# Patient Record
Sex: Female | Born: 1995 | Race: Black or African American | Hispanic: No | Marital: Single | Smoking: Never smoker
Health system: Southern US, Community
[De-identification: ages and names within clinical notes are randomized; demographics above are authoritative.]

## PROBLEM LIST (undated history)

## (undated) ENCOUNTER — Inpatient Hospital Stay (HOSPITAL_COMMUNITY): Payer: Self-pay

## (undated) DIAGNOSIS — M419 Scoliosis, unspecified: Secondary | ICD-10-CM

## (undated) DIAGNOSIS — N200 Calculus of kidney: Secondary | ICD-10-CM

## (undated) HISTORY — DX: Scoliosis, unspecified: M41.9

## (undated) HISTORY — DX: Calculus of kidney: N20.0

---
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
## 2015-12-04 ENCOUNTER — Encounter (HOSPITAL_COMMUNITY): Payer: Self-pay | Admitting: *Deleted

## 2015-12-04 ENCOUNTER — Inpatient Hospital Stay (HOSPITAL_COMMUNITY): Payer: Managed Care, Other (non HMO)

## 2015-12-04 ENCOUNTER — Inpatient Hospital Stay (HOSPITAL_COMMUNITY)
Admission: AD | Admit: 2015-12-04 | Discharge: 2015-12-04 | Disposition: A | Discharge status: Home / Self Care | Payer: Managed Care, Other (non HMO) | Source: Ambulatory Visit | Attending: Family Medicine | Admitting: Family Medicine

## 2015-12-04 DIAGNOSIS — R102 Pelvic and perineal pain: Secondary | ICD-10-CM | POA: Diagnosis present

## 2015-12-04 DIAGNOSIS — Z3A08 8 weeks gestation of pregnancy: Secondary | ICD-10-CM | POA: Insufficient documentation

## 2015-12-04 DIAGNOSIS — Z3490 Encounter for supervision of normal pregnancy, unspecified, unspecified trimester: Secondary | ICD-10-CM

## 2015-12-04 DIAGNOSIS — R109 Unspecified abdominal pain: Secondary | ICD-10-CM | POA: Insufficient documentation

## 2015-12-04 DIAGNOSIS — O26891 Other specified pregnancy related conditions, first trimester: Secondary | ICD-10-CM

## 2015-12-04 DIAGNOSIS — O26899 Other specified pregnancy related conditions, unspecified trimester: Secondary | ICD-10-CM

## 2015-12-04 DIAGNOSIS — M549 Dorsalgia, unspecified: Secondary | ICD-10-CM | POA: Insufficient documentation

## 2015-12-04 DIAGNOSIS — O9989 Other specified diseases and conditions complicating pregnancy, childbirth and the puerperium: Secondary | ICD-10-CM

## 2015-12-04 LAB — URINALYSIS, ROUTINE W REFLEX MICROSCOPIC
BILIRUBIN URINE: NEGATIVE
Glucose, UA: NEGATIVE mg/dL
Ketones, ur: 15 mg/dL
Leukocytes, UA: NEGATIVE
NITRITE: NEGATIVE
Protein, ur: NEGATIVE mg/dL
SPECIFIC GRAVITY, URINE: 1.015 (ref 1.005–1.030)
pH: 6 (ref 5.0–8.0)

## 2015-12-04 LAB — CBC
HEMATOCRIT: 33.7 % — AB (ref 36.0–46.0)
HEMOGLOBIN: 11.5 g/dL — AB (ref 12.0–15.0)
MCH: 29.8 pg (ref 26.0–34.0)
MCHC: 34.1 g/dL (ref 30.0–36.0)
MCV: 87.3 fL (ref 78.0–100.0)
Platelets: 253 10*3/uL (ref 150–400)
RBC: 3.86 MIL/uL — ABNORMAL LOW (ref 3.87–5.11)
RDW: 12.7 % (ref 11.5–15.5)
WBC: 7.4 10*3/uL (ref 4.0–10.5)

## 2015-12-04 LAB — HCG, QUANTITATIVE, PREGNANCY: hCG, Beta Chain, Quant, S: 80829 m[IU]/mL — ABNORMAL HIGH (ref <5)

## 2015-12-04 LAB — URINE MICROSCOPIC-ADD ON

## 2015-12-04 LAB — POCT PREGNANCY, URINE: PREG TEST UR: POSITIVE — AB

## 2015-12-04 NOTE — MAU Provider Note (Signed)
History     CSN: [id number]  Arrival date and time: 12/04/15 1439   First Provider Initiated Contact with Patient 12/04/15 2105      Chief Complaint  Patient presents with  . Pelvic Pain   Pelvic Pain  The patient's primary symptoms include pelvic pain. This is a new problem. The current episode started yesterday. The problem occurs constantly. The problem has been unchanged. Pain severity now: 7/10  The problem affects the left side. She is pregnant. Associated symptoms include abdominal pain. Pertinent negatives include no chills, constipation, diarrhea, dysuria, fever, frequency, nausea, urgency or vomiting. The vaginal discharge was normal. There has been no bleeding. Nothing aggravates the symptoms. She has tried nothing for the symptoms. She is sexually active. Menstrual history: LMP 09/30/15    No past medical history on file.  No past surgical history on file.  No family history on file.  Social History  Substance Use Topics  . Smoking status: Not on file  . Smokeless tobacco: Not on file  . Alcohol use Not on file    Allergies: Allergies not on file  No prescriptions prior to admission.    Review of Systems  Constitutional: Negative for chills and fever.  Gastrointestinal: Positive for abdominal pain. Negative for constipation, diarrhea, nausea and vomiting.  Genitourinary: Positive for pelvic pain. Negative for dysuria, frequency and urgency.   Physical Exam   Blood pressure 112/77, pulse 66, temperature 98.3 F (36.8 C), temperature source Oral, resp. rate 16, height 5\' 3"  (1.6 m), weight 127 lb (57.6 kg), last menstrual period 09/30/2015.  Physical Exam  Nursing note and vitals reviewed. Constitutional: She is oriented to person, place, and time. She appears well-developed and well-nourished. No distress.  HENT:  Head: Normocephalic.  Cardiovascular: Normal rate.   Respiratory: Effort normal.  GI: Soft.  Neurological: She is alert and oriented to person,  place, and time.  Skin: Skin is warm and dry.  Psychiatric: She has a normal mood and affect.   Results for orders placed or performed during the hospital encounter of 12/04/15 (from the past 24 hour(s))  Urinalysis, Routine w reflex microscopic (not at [facility])     Status: Abnormal   Collection Time: 12/04/15  4:20 PM  Result Value Ref Range   Color, Urine YELLOW YELLOW   APPearance CLEAR CLEAR   Specific Gravity, Urine 1.015 1.005 - 1.030   pH 6.0 5.0 - 8.0   Glucose, UA NEGATIVE NEGATIVE mg/dL   Hgb urine dipstick TRACE (A) NEGATIVE   Bilirubin Urine NEGATIVE NEGATIVE   Ketones, ur 15 NEGATIVE mg/dL   Protein, ur NEGATIVE NEGATIVE mg/dL   Nitrite NEGATIVE NEGATIVE   Leukocytes, UA NEGATIVE NEGATIVE  Urine microscopic-add on     Status: Abnormal   Collection Time: 12/04/15  4:20 PM  Result Value Ref Range   Squamous Epithelial / LPF 6-30 (A) NONE SEEN   WBC, UA 0-5 0 - 5 WBC/hpf   RBC / HPF 0-5 0 - 5 RBC/hpf   Bacteria, UA MANY (A) NONE SEEN   Urine-Other MUCOUS PRESENT   Pregnancy, urine POC     Status: Abnormal   Collection Time: 12/04/15  4:30 PM  Result Value Ref Range   Preg Test, Ur POSITIVE (A) NEGATIVE  CBC     Status: Abnormal   Collection Time: 12/04/15  6:55 PM  Result Value Ref Range   WBC 7.4 4.0 - 10.5 K/uL   RBC 3.86 (L) 3.87 - 5.11 MIL/uL   Hemoglobin 11.5 (  L) 12.0 - 15.0 g/dL   HCT 78.233.7 (L) 95.636.0 - 21.346.0 %   MCV 87.3 78.0 - 100.0 fL   MCH 29.8 26.0 - 34.0 pg   MCHC 34.1 30.0 - 36.0 g/dL   RDW 08.612.7 57.811.5 - 46.915.5 %   Platelets 253 150 - 400 K/uL  ABO/Rh     Status: None (Preliminary result)   Collection Time: 12/04/15  6:55 PM  Result Value Ref Range   ABO/RH(D) A POS   hCG, quantitative, pregnancy     Status: Abnormal   Collection Time: 12/04/15  6:55 PM  Result Value Ref Range   hCG, Beta Chain, Quant, S 80,829 (H) <5 mIU/mL   [place] Ob Comp Less 14 Wks  Result Date: 12/04/2015 CLINICAL DATA:  Abdominal pain for a couple of days. EXAM: OBSTETRIC <14 WK US  AND TRANSVAGINAL OB US TECHNIQUE: Both transabdominal and transvaginal ultrasound examinations were performed for complete evaluation of the gestation as well as the maternal uterus, adnexal regions, and pelvic cul-de-sac. Transvaginal technique was performed to assess early pregnancy. COMPARISON:  None. FINDINGS: Intrauterine gestational sac: Single Yolk sac:  Present Embryo:  Present Cardiac Activity: Present Heart Rate: 159  bpm CRL:  19.4  mm   8 w   3 d                  US EDC: 07/12/2016 Subchorionic hemorrhage:  Small Maternal uterus/adnexae: Unremarkable appearance of the ovaries. No free fluid. IMPRESSION: Single living intrauterine pregnancy as above. Electronically Signed   By: Sebastian AcheAllen  Grady M.D.   On: 12/04/2015 21:02   [place] Ob Transvaginal  Result Date: 12/04/2015 CLINICAL DATA:  Abdominal pain for a couple of days. EXAM: OBSTETRIC <14 WK US AND TRANSVAGINAL OB US TECHNIQUE: Both transabdominal and transvaginal ultrasound examinations were performed for complete evaluation of the gestation as well as the maternal uterus, adnexal regions, and pelvic cul-de-sac. Transvaginal technique was performed to assess early pregnancy. COMPARISON:  None. FINDINGS: Intrauterine gestational sac: Single Yolk sac:  Present Embryo:  Present Cardiac Activity: Present Heart Rate: 159  bpm CRL:  19.4  mm   8 w   3 d                  US EDC: 07/12/2016 Subchorionic hemorrhage:  Small Maternal uterus/adnexae: Unremarkable appearance of the ovaries. No free fluid. IMPRESSION: Single living intrauterine pregnancy as above. Electronically Signed   By: Sebastian AcheAllen  Grady M.D.   On: 12/04/2015 21:02    MAU Course  Procedures  MDM   Assessment and Plan   1. Back pain affecting pregnancy in first trimester   2. Abdominal pain affecting pregnancy   3. Intrauterine pregnancy    DC home Comfort measures reviewed  1st Trimester precautions  RX: none  Return to MAU as needed FU with OB as planned  Follow-up Information     [facility].   Contact information: [address] [place] [id number] [phone]            Tawnya CrookHogan, Avangeline Stockburger Donovan 12/04/2015, 9:05 PM

## 2015-12-04 NOTE — Discharge Instructions (Signed)
Prenatal Care Providers °[facility]    [facility] ° Phone- [phone]     Phone: [phone] °         °[facility]                      [facility] ° @[place]     Phone: [phone] ° Phone: [phone] °        [facility] °[facility]     Phone: [phone] ° Phone: [phone]   °        [facility] °[facility] [place]                hone: [phone] ° Phone: [phone] °        [facility] °Dr. Bernard Marshall      Phone: [phone] ° Phone: [phone] °        [facility] °[facility]                Phone: [phone] ° Women’s [facility]  ° Phone:[phone]    [facility]) °         Phone: [phone] °[facility] °  Phone: [phone] °Safe Medications in Pregnancy  ° °Acne: °Benzoyl Peroxide °Salicylic Acid ° °Backache/Headache: °Tylenol: 2 regular strength every 4 hours OR °             2 Extra strength every 6 hours ° °Colds/Coughs/Allergies: °Benadryl (alcohol free) 25 mg every 6 hours as needed °Breath right strips °Claritin °Cepacol throat lozenges °Chloraseptic throat spray °Cold-Eeze- up to three times per day °Cough drops, alcohol free °Flonase (by prescription only) °Guaifenesin °Mucinex °Robitussin DM (plain only, alcohol free) °Saline nasal spray/drops °Sudafed (pseudoephedrine) & Actifed ** use only after [redacted] weeks gestation and if you do not have high blood pressure °Tylenol °Vicks Vaporub °Zinc lozenges °Zyrtec  ° °Constipation: °Colace °Ducolax suppositories °Fleet enema °Glycerin suppositories °Metamucil °Milk of magnesia °Miralax °Senokot °Smooth move tea ° °Diarrhea: °Kaopectate °Imodium A-D ° °*NO pepto Bismol ° °Hemorrhoids: °Anusol °Anusol HC °Preparation H °Tucks ° °Indigestion: °Tums °Maalox °Mylanta °Zantac  °Pepcid ° °Insomnia: °Benadryl (alcohol free) 25mg every 6 hours as needed °Tylenol  PM °Unisom, no Gelcaps ° °Leg Cramps: °Tums °MagGel ° °Nausea/Vomiting:  °Bonine °Dramamine °Emetrol °Ginger extract °Sea bands °Meclizine  °Nausea medication to take during pregnancy:  °Unisom (doxylamine succinate 25 mg tablets) Take one tablet daily at bedtime. If symptoms are not adequately controlled, the dose can be increased to a maximum recommended dose of two tablets daily (1/2 tablet in the morning, 1/2 tablet mid-afternoon and one at bedtime). °Vitamin B6 100mg tablets. Take one tablet twice a day (up to 200 mg per day). ° °Skin Rashes: °Aveeno products °Benadryl cream or 25mg every 6 hours as needed °Calamine Lotion °1% cortisone cream ° °Yeast infection: °Gyne-lotrimin 7 °Monistat 7 ° ° °**If taking multiple medications, please check labels to avoid duplicating the same active ingredients °**take medication as directed on the label °** Do not exceed 4000 mg of tylenol in 24 hours °**Do not take medications that contain aspirin or ibuprofen ° ° ° ° °

## 2015-12-04 NOTE — MAU Note (Signed)
Stomach has been really bad for a couple days now.  Hasn't had period since Sept.  +HPT 3 days ago.

## 2015-12-05 LAB — HIV ANTIBODY (ROUTINE TESTING W REFLEX): HIV Screen 4th Generation wRfx: NONREACTIVE

## 2015-12-05 LAB — ABO/RH: ABO/RH(D): A POS

## 2015-12-19 ENCOUNTER — Encounter: Payer: Self-pay | Admitting: Family Medicine

## 2016-01-03 ENCOUNTER — Encounter: Payer: Self-pay | Admitting: Family

## 2016-01-07 ENCOUNTER — Encounter: Payer: Self-pay | Admitting: Student

## 2016-01-07 ENCOUNTER — Ambulatory Visit (INDEPENDENT_AMBULATORY_CARE_PROVIDER_SITE_OTHER): Payer: Managed Care, Other (non HMO) | Admitting: Student

## 2016-01-07 VITALS — BP 112/57 | HR 73 | Wt 124.3 lb

## 2016-01-07 DIAGNOSIS — Z113 Encounter for screening for infections with a predominantly sexual mode of transmission: Secondary | ICD-10-CM

## 2016-01-07 DIAGNOSIS — Z124 Encounter for screening for malignant neoplasm of cervix: Secondary | ICD-10-CM

## 2016-01-07 DIAGNOSIS — Z23 Encounter for immunization: Secondary | ICD-10-CM

## 2016-01-07 DIAGNOSIS — Z3492 Encounter for supervision of normal pregnancy, unspecified, second trimester: Secondary | ICD-10-CM

## 2016-01-07 DIAGNOSIS — M419 Scoliosis, unspecified: Secondary | ICD-10-CM | POA: Insufficient documentation

## 2016-01-07 DIAGNOSIS — O0993 Supervision of high risk pregnancy, unspecified, third trimester: Secondary | ICD-10-CM | POA: Insufficient documentation

## 2016-01-07 DIAGNOSIS — Z3490 Encounter for supervision of normal pregnancy, unspecified, unspecified trimester: Secondary | ICD-10-CM

## 2016-01-07 DIAGNOSIS — Z87442 Personal history of urinary calculi: Secondary | ICD-10-CM

## 2016-01-07 LAB — POCT URINALYSIS DIP (DEVICE)
Glucose, UA: NEGATIVE mg/dL
KETONES UR: 80 mg/dL — AB
LEUKOCYTES UA: NEGATIVE
NITRITE: NEGATIVE
Protein, ur: NEGATIVE mg/dL
Specific Gravity, Urine: >=1.03 (ref 1.005–1.030)
Urobilinogen, UA: 0.2 mg/dL (ref 0.0–1.0)
pH: 5.5 (ref 5.0–8.0)

## 2016-01-07 MED ORDER — PRENATAL VITAMINS 28-0.8 MG PO TABS: 1.0000 | ORAL_TABLET | Freq: Every day | ORAL | 9 refills | Status: AC

## 2016-01-07 NOTE — Progress Notes (Signed)
Addendum , called Jeanne Duran to discuss if she wants genetic testing which she does, will come in next week for quad screen.

## 2016-01-07 NOTE — Progress Notes (Addendum)
   PRENATAL VISIT NOTE  Subjective:  Jeanne Duran is a 20 y.o. G1P0 at 5459w1d by LMP being seen today for ongoing prenatal care.  She is currently monitored for the following issues for this low-risk pregnancy and has ADHD (attention deficit hyperactivity disorder); Supervision of normal pregnancy; and History of kidney stones on her problem list.  Patient reports no complaints.  Contractions: Not present. Vag. Bleeding: None.  Movement: Absent. Denies leaking of fluid.   The following portions of the patient's history were reviewed and updated as appropriate: allergies, current medications, past family history, past medical history, past social history, past surgical history and problem list. Problem list updated.  Objective:   Vitals:   01/07/16 1021  BP: (!) 112/57  Pulse: 73  Weight: 124 lb 4.8 oz (56.4 kg)    Fetal Status: Fetal Heart Rate (bpm): 156   Movement: Absent     General:  Alert, oriented and cooperative. Patient is in no acute distress.  Skin: Skin is warm and dry. No rash noted.   Cardiovascular: Normal heart rate noted  Respiratory: Normal respiratory effort, no problems with respiration noted  Abdomen: Soft, gravid, appropriate for gestational age. Pain/Pressure: Present     Pelvic:  Cervical exam deferred       Vaginal and labia are normal; no discharge present. Cervix is pink and non-erythematous; no CMT.   Extremities: Normal range of motion.  Edema: None  Mental Status: Normal mood and affect. Normal behavior. Normal judgment and thought content.   Assessment and Plan:  Pregnancy: G1P0 at 9059w1d  1. Encounter for supervision of normal pregnancy, antepartum, unspecified gravidity Patient feels good; she is excited to be pregnant.   - Pain Mgmt, Profile 6 Conf w/o mM, U - Prenatal Profile - Hemoglobinopathy Evaluation - Cytology - PAP - GC/Chlamydia probe amp ([place])not at [facility] - Culture, OB Urine - US MFM OB COMP + 14 WK; Future - Prenatal Vit-Fe  Fumarate-FA (PRENATAL VITAMINS) 28-0.8 MG TABS; Take 1 tablet by mouth daily.  Dispense: 30 tablet; Refill: 9 - Flu Vaccine QUAD 36+ mos IM  2. History of kidney stones No signs and symptoms Please refer to After Visit Summary for other counseling recommendations.  Return in about 10 weeks (around 03/17/2016).   Marylene LandKathryn Lorraine Yasser Hepp, CNM

## 2016-01-07 NOTE — Progress Notes (Signed)
Here for initial prenatal visit. Given new patient education information. Enrolled in Baby Scripts optimization.

## 2016-01-08 LAB — CYTOLOGY - PAP
ADEQUACY: ABSENT
Diagnosis: NEGATIVE

## 2016-01-08 LAB — GC/CHLAMYDIA PROBE AMP (~~LOC~~) NOT AT ARMC
CHLAMYDIA, DNA PROBE: NEGATIVE
NEISSERIA GONORRHEA: NEGATIVE

## 2016-01-08 LAB — CULTURE, OB URINE: Organism ID, Bacteria: NO GROWTH

## 2016-01-09 LAB — PRENATAL PROFILE (SOLSTAS)
Antibody Screen: NEGATIVE
BASOS ABS: 0 {cells}/uL (ref 0–200)
BASOS PCT: 0 %
Eosinophils Absolute: 60 cells/uL (ref 15–500)
Eosinophils Relative: 1 %
HCT: 34.8 % — ABNORMAL LOW (ref 35.0–45.0)
HEP B S AG: NEGATIVE
HIV 1&2 Ab, 4th Generation: NONREACTIVE
Hemoglobin: 11.2 g/dL — ABNORMAL LOW (ref 11.7–15.5)
LYMPHS ABS: 1380 {cells}/uL (ref 850–3900)
Lymphocytes Relative: 23 %
MCH: 29 pg (ref 27.0–33.0)
MCHC: 32.2 g/dL (ref 32.0–36.0)
MCV: 90.2 fL (ref 80.0–100.0)
MONO ABS: 360 {cells}/uL (ref 200–950)
MPV: 10 fL (ref 7.5–12.5)
Monocytes Relative: 6 %
NEUTROS ABS: 4200 {cells}/uL (ref 1500–7800)
Neutrophils Relative %: 70 %
PLATELETS: 261 10*3/uL (ref 140–400)
RBC: 3.86 MIL/uL (ref 3.80–5.10)
RDW: 13.6 % (ref 11.0–15.0)
RUBELLA: 8.9 {index} — AB (ref <0.90)
Rh Type: POSITIVE
WBC: 6 10*3/uL (ref 3.8–10.8)

## 2016-01-10 LAB — HEMOGLOBINOPATHY EVALUATION
HEMATOCRIT: 34.8 % — AB (ref 35.0–45.0)
HEMOGLOBIN: 11.2 g/dL — AB (ref 11.7–15.5)
HGB A2 QUANT: 2.8 % (ref 1.8–3.5)
Hgb A: 96.2 % (ref >96.0)
Hgb F Quant: <1 % (ref <2.0)
MCH: 29 pg (ref 27.0–33.0)
MCV: 90.2 fL (ref 80.0–100.0)
RBC: 3.86 MIL/uL (ref 3.80–5.10)
RDW: 13.6 % (ref 11.0–15.0)

## 2016-01-11 LAB — PAIN MGMT, PROFILE 6 CONF W/O MM, U
6 Acetylmorphine: NEGATIVE ng/mL (ref <10)
AMPHETAMINES: NEGATIVE ng/mL (ref <500)
Alcohol Metabolites: NEGATIVE ng/mL (ref <500)
BENZODIAZEPINES: NEGATIVE ng/mL (ref <100)
Barbiturates: NEGATIVE ng/mL (ref <300)
COCAINE METABOLITE: NEGATIVE ng/mL (ref <150)
CREATININE: 289.8 mg/dL (ref >=20.0)
Marijuana Metabolite: 320 ng/mL — ABNORMAL HIGH (ref <5)
Marijuana Metabolite: POSITIVE ng/mL — AB (ref <20)
Methadone Metabolite: NEGATIVE ng/mL (ref <100)
OPIATES: NEGATIVE ng/mL (ref <100)
OXYCODONE: NEGATIVE ng/mL (ref <100)
Oxidant: POSITIVE ug/mL — AB (ref <200)
Phencyclidine: NEGATIVE ng/mL (ref <25)
Please note:: 0
pH: 7.18 (ref 4.5–9.0)

## 2016-01-14 ENCOUNTER — Other Ambulatory Visit: Payer: Managed Care, Other (non HMO)

## 2016-01-14 DIAGNOSIS — Z3402 Encounter for supervision of normal first pregnancy, second trimester: Secondary | ICD-10-CM

## 2016-01-15 LAB — AFP, QUAD SCREEN
AFP: 30.7 ng/mL
Age Alone: 1:1140 {titer}
CURR GEST AGE: 15.1 wk
HCG, Total: 43.91 IU/mL
INH: 229.5 pg/mL
INTERPRETATION-AFP: NEGATIVE
MOM FOR AFP: 0.81
MOM FOR HCG: 0.71
MOM FOR INH: 1.16
OPEN SPINA BIFIDA: NEGATIVE
Osb Risk: 1:52100 {titer}
Tri 18 Scr Risk Est: NEGATIVE
Trisomy 18 (Edward) Syndrome Interp.: 1:14500 {titer}
uE3 Mom: 0.73
uE3 Value: 0.45 ng/mL

## 2016-02-01 ENCOUNTER — Encounter (HOSPITAL_COMMUNITY): Payer: Self-pay | Admitting: Student

## 2016-02-05 ENCOUNTER — Encounter: Payer: Managed Care, Other (non HMO) | Admitting: Medical

## 2016-02-05 ENCOUNTER — Ambulatory Visit (INDEPENDENT_AMBULATORY_CARE_PROVIDER_SITE_OTHER): Payer: Managed Care, Other (non HMO) | Admitting: Advanced Practice Midwife

## 2016-02-05 VITALS — BP 97/61 | HR 69 | Wt 126.0 lb

## 2016-02-05 DIAGNOSIS — Z3490 Encounter for supervision of normal pregnancy, unspecified, unspecified trimester: Secondary | ICD-10-CM

## 2016-02-05 DIAGNOSIS — Z3492 Encounter for supervision of normal pregnancy, unspecified, second trimester: Secondary | ICD-10-CM

## 2016-02-05 NOTE — Progress Notes (Signed)
   PRENATAL VISIT NOTE  Subjective:  Jeanne Duran is a 21 y.o. G1P0 at 5254w2d being seen today for ongoing prenatal care.  She is currently monitored for the following issues for this low-risk pregnancy and has ADHD (attention deficit hyperactivity disorder); Supervision of normal pregnancy; History of kidney stones; and Scoliosis on her problem list.  Patient reports no complaints.  Contractions: Not present. Vag. Bleeding: None.  Movement: Absent. Denies leaking of fluid.   The following portions of the patient's history were reviewed and updated as appropriate: allergies, current medications, past family history, past medical history, past social history, past surgical history and problem list. Problem list updated.  Objective:   Vitals:   02/05/16 1343  BP: 97/61  Pulse: 69  Weight: 126 lb (57.2 kg)    Fetal Status: Fetal Heart Rate (bpm): 150   Movement: Absent     General:  Alert, oriented and cooperative. Patient is in no acute distress.  Skin: Skin is warm and dry. No rash noted.   Cardiovascular: Normal heart rate noted  Respiratory: Normal respiratory effort, no problems with respiration noted  Abdomen: Soft, gravid, appropriate for gestational age. Pain/Pressure: Present     Pelvic:  Cervical exam deferred        Extremities: Normal range of motion.  Edema: None  Mental Status: Normal mood and affect. Normal behavior. Normal judgment and thought content.   Assessment and Plan:  Pregnancy: G1P0 at 6154w2d  1. Encounter for supervision of normal pregnancy, antepartum, unspecified gravidity   Preterm labor symptoms and general obstetric precautions including but not limited to vaginal bleeding, contractions, leaking of fluid and fetal movement were reviewed in detail with the patient. Please refer to After Visit Summary for other counseling recommendations.  Return in about 4 weeks (around 03/04/2016).   Hurshel PartyLisa A Leftwich-Kirby, CNM

## 2016-02-05 NOTE — Patient Instructions (Signed)
Second Trimester of Pregnancy The second trimester is from week 13 through week 28 (months 4 through 6). The second trimester is often a time when you feel your best. Your body has also adjusted to being pregnant, and you begin to feel better physically. Usually, morning sickness has lessened or quit completely, you may have more energy, and you may have an increase in appetite. The second trimester is also a time when the fetus is growing rapidly. At the end of the sixth month, the fetus is about 9 inches long and weighs about 1 pounds. You will likely begin to feel the baby move (quickening) between 18 and 20 weeks of the pregnancy. Body changes during your second trimester Your body continues to go through many changes during your second trimester. The changes vary from woman to woman.  Your weight will continue to increase. You will notice your lower abdomen bulging out.  You may begin to get stretch marks on your hips, abdomen, and breasts.  You may develop headaches that can be relieved by medicines. The medicines should be approved by your health care provider.  You may urinate more often because the fetus is pressing on your bladder.  You may develop or continue to have heartburn as a result of your pregnancy.  You may develop constipation because certain hormones are causing the muscles that push waste through your intestines to slow down.  You may develop hemorrhoids or swollen, bulging veins (varicose veins).  You may have back pain. This is caused by:  Weight gain.  Pregnancy hormones that are relaxing the joints in your pelvis.  A shift in weight and the muscles that support your balance.  Your breasts will continue to grow and they will continue to become tender.  Your gums may bleed and may be sensitive to brushing and flossing.  Dark spots or blotches (chloasma, mask of pregnancy) may develop on your face. This will likely fade after the baby is born.  A dark line  from your belly button to the pubic area (linea nigra) may appear. This will likely fade after the baby is born.  You may have changes in your hair. These can include thickening of your hair, rapid growth, and changes in texture. Some women also have hair loss during or after pregnancy, or hair that feels dry or thin. Your hair will most likely return to normal after your baby is born. What to expect at prenatal visits During a routine prenatal visit:  You will be weighed to make sure you and the fetus are growing normally.  Your blood pressure will be taken.  Your abdomen will be measured to track your baby's growth.  The fetal heartbeat will be listened to.  Any test results from the previous visit will be discussed. Your health care provider may ask you:  How you are feeling.  If you are feeling the baby move.  If you have had any abnormal symptoms, such as leaking fluid, bleeding, severe headaches, or abdominal cramping.  If you are using any tobacco products, including cigarettes, chewing tobacco, and electronic cigarettes.  If you have any questions. Other tests that may be performed during your second trimester include:  Blood tests that check for:  Low iron levels (anemia).  Gestational diabetes (between 24 and 28 weeks).  Rh antibodies. This is to check for a protein on red blood cells (Rh factor).  Urine tests to check for infections, diabetes, or protein in the urine.  An ultrasound to   confirm the proper growth and development of the baby.  An amniocentesis to check for possible genetic problems.  Fetal screens for spina bifida and Down syndrome.  HIV (human immunodeficiency virus) testing. Routine prenatal testing includes screening for HIV, unless you choose not to have this test. Follow these instructions at home: Eating and drinking  Continue to eat regular, healthy meals.  Avoid raw meat, uncooked cheese, cat litter boxes, and soil used by cats. These  carry germs that can cause birth defects in the baby.  Take your prenatal vitamins.  Take 1500-2000 mg of calcium daily starting at the 20th week of pregnancy until you deliver your baby.  If you develop constipation:  Take over-the-counter or prescription medicines.  Drink enough fluid to keep your urine clear or pale yellow.  Eat foods that are high in fiber, such as fresh fruits and vegetables, whole grains, and beans.  Limit foods that are high in fat and processed sugars, such as fried and sweet foods. Activity  Exercise only as directed by your health care provider. Experiencing uterine cramps is a good sign to stop exercising.  Avoid heavy lifting, wear low heel shoes, and practice good posture.  Wear your seat belt at all times when driving.  Rest with your legs elevated if you have leg cramps or low back pain.  Wear a good support bra for breast tenderness.  Do not use hot tubs, steam rooms, or saunas. Lifestyle  Avoid all smoking, herbs, alcohol, and unprescribed drugs. These chemicals affect the formation and growth of the baby.  Do not use any products that contain nicotine or tobacco, such as cigarettes and e-cigarettes. If you need help quitting, ask your health care provider.  A sexual relationship may be continued unless your health care provider directs you otherwise. General instructions  Follow your health care provider's instructions regarding medicine use. There are medicines that are either safe or unsafe to take during pregnancy.  Take warm sitz baths to soothe any pain or discomfort caused by hemorrhoids. Use hemorrhoid cream if your health care provider approves.  If you develop varicose veins, wear support hose. Elevate your feet for 15 minutes, 3-4 times a day. Limit salt in your diet.  Visit your dentist if you have not gone yet during your pregnancy. Use a soft toothbrush to brush your teeth and be gentle when you floss.  Keep all follow-up  prenatal visits as told by your health care provider. This is important. Contact a health care provider if:  You have dizziness.  You have mild pelvic cramps, pelvic pressure, or nagging pain in the abdominal area.  You have persistent nausea, vomiting, or diarrhea.  You have a bad smelling vaginal discharge.  You have pain with urination. Get help right away if:  You have a fever.  You are leaking fluid from your vagina.  You have spotting or bleeding from your vagina.  You have severe abdominal cramping or pain.  You have rapid weight gain or weight loss.  You have shortness of breath with chest pain.  You notice sudden or extreme swelling of your face, hands, ankles, feet, or legs.  You have not felt your baby move in over an hour.  You have severe headaches that do not go away with medicine.  You have vision changes. Summary  The second trimester is from week 13 through week 28 (months 4 through 6). It is also a time when the fetus is growing rapidly.  Your body goes   through many changes during pregnancy. The changes vary from woman to woman.  Avoid all smoking, herbs, alcohol, and unprescribed drugs. These chemicals affect the formation and growth your baby.  Do not use any tobacco products, such as cigarettes, chewing tobacco, and e-cigarettes. If you need help quitting, ask your health care provider.  Contact your health care provider if you have any questions. Keep all prenatal visits as told by your health care provider. This is important. This information is not intended to replace advice given to you by your health care provider. Make sure you discuss any questions you have with your health care provider. Document Released: 01/07/2001 Document Revised: 06/21/2015 Document Reviewed: 03/16/2012 Elsevier Interactive Patient Education  2017 [employer]  

## 2016-02-11 ENCOUNTER — Ambulatory Visit (HOSPITAL_COMMUNITY)
Admission: RE | Admit: 2016-02-11 | Discharge: 2016-02-11 | Disposition: A | Discharge status: Home / Self Care | Payer: Managed Care, Other (non HMO) | Source: Ambulatory Visit | Attending: Student | Admitting: Student

## 2016-02-11 DIAGNOSIS — Z3689 Encounter for other specified antenatal screening: Secondary | ICD-10-CM | POA: Diagnosis not present

## 2016-02-11 DIAGNOSIS — Z3A18 18 weeks gestation of pregnancy: Secondary | ICD-10-CM | POA: Insufficient documentation

## 2016-02-11 DIAGNOSIS — Z3490 Encounter for supervision of normal pregnancy, unspecified, unspecified trimester: Secondary | ICD-10-CM

## 2016-03-03 ENCOUNTER — Encounter: Payer: Managed Care, Other (non HMO) | Admitting: Student

## 2016-03-04 ENCOUNTER — Ambulatory Visit (INDEPENDENT_AMBULATORY_CARE_PROVIDER_SITE_OTHER): Payer: Managed Care, Other (non HMO) | Admitting: Advanced Practice Midwife

## 2016-03-04 ENCOUNTER — Encounter: Payer: Self-pay | Admitting: Advanced Practice Midwife

## 2016-03-04 VITALS — BP 101/59 | HR 72 | Wt 132.4 lb

## 2016-03-04 DIAGNOSIS — Z3402 Encounter for supervision of normal first pregnancy, second trimester: Secondary | ICD-10-CM

## 2016-03-04 DIAGNOSIS — O283 Abnormal ultrasonic finding on antenatal screening of mother: Secondary | ICD-10-CM | POA: Insufficient documentation

## 2016-03-04 NOTE — Patient Instructions (Addendum)
AREA PEDIATRIC/FAMILY PRACTICE PHYSICIANS  [facility] [address], [address] [place], [place]  [address] Phone - [phone]   Fax - [phone]  [facility] [address] [place], [place] [address] Phone - [phone]   Fax - [phone]  [facility] [address] [place], [place]  [address] Phone - [phone]   Fax - [phone]  [facility] [address] [place], [place]  [address] Phone - [phone]   Fax - [phone]  [facility] [address] [place], [place]  [address] Phone - [phone]   Fax - [phone]  [facility] [address], [address] [place], [place]  [address] Phone - [phone]   Fax - [phone]  [facility] [address] [place], [place]  [address] Phone - [phone]   Fax - [phone]  [facility] [address], [address] [place], [place]  [address] Phone - [phone]   Fax - [phone]  [facility] [address] [place], [place]  [address] Phone - [phone]   Fax - [phone] [facility] [address] [place], [place]  [address] Phone - [phone]   Fax - [phone]  [facility] [address] [place], [place]  [address] Phone - [phone]   Fax - [phone]  [facility] [address] [place], [place]  [address] Phone - [phone]   Fax - [phone]  [facility] [address], [address] [place], [place]  [address] Phone - [phone]   Fax - [phone]  SHILPA GOSRANI [address] [place], [place]  [address] Phone - [phone]  [facility] [address] [place], [place]  [address] Phone - [phone]   Fax - [phone]  [facility] [address] [place], [place]  [address] Phone - [phone]   Fax - [phone]  [facility] [address] [place], [place]  [address] Phone - [phone]   Fax - [phone]  [facility] [address] [place], [place]  [address] Phone - [phone]   Fax - [phone]   [facility] [address] [place], [place]  [address] Phone - [phone]   Fax - [phone]  [facility] [address] [place], [place]  [address] Phone - [phone]   Fax - [phone]  DAVID RUBIN [address] [place], [place]  [address] Phone - [phone]   Fax - [phone]  [facility] [address] [place], [place]  [address] Phone - [phone]   Fax - [phone]  [facility] [address] , [place]  [address] Phone - [phone]   Fax - [phone] [facility] [address] [place], [place]  [address] Phone - [phone]   Fax - [phone]  [facility] [address] [place], [place]  [address] Phone - [phone]   Fax - [phone]  [facility] [address] [place],   [address] Phone - [phone]   Fax - [phone]    Second Trimester of Pregnancy The second trimester is from week 13 through week 28, month 4 through 6. This is often the time in pregnancy that you feel your best. Often times,   sickness has lessened or quit. You may have more energy, and you may get hungry more often. Your unborn baby (fetus) is growing rapidly. At the end of the sixth month, he or she is about 9 inches long and weighs about 1 pounds. You will likely feel the baby move (quickening) between 18 and 20 weeks of pregnancy. Follow these instructions at home:  Avoid all smoking, herbs, and alcohol. Avoid drugs not approved by your doctor.  Do not use any  tobacco products, including cigarettes, chewing tobacco, and electronic cigarettes. If you need help quitting, ask your doctor. You may get counseling or other support to help you quit.  Only take medicine as told by your doctor. Some medicines are safe and some are not during pregnancy.  Exercise only as told by your doctor. Stop exercising if you start having cramps.  Eat regular, healthy meals.  Wear a good support bra if your breasts are tender.  Do not use hot tubs, steam rooms, or saunas.  Wear your seat belt when driving.  Avoid raw meat, uncooked cheese, and liter boxes and soil used by cats.  Take your prenatal vitamins.  Take 1500-2000 milligrams of calcium daily starting at the 20th week of pregnancy until you deliver your baby.  Try taking medicine that helps you poop (stool softener) as needed, and if your doctor approves. Eat more fiber by eating fresh fruit, vegetables, and whole grains. Drink enough fluids to keep your pee (urine) clear or pale yellow.  Take warm water baths (sitz baths) to soothe pain or discomfort caused by hemorrhoids. Use hemorrhoid cream if your doctor approves.  If you have puffy, bulging veins (varicose veins), wear support hose. Raise (elevate) your feet for 15 minutes, 3-4 times a day. Limit salt in your diet.  Avoid heavy lifting, wear low heals, and sit up straight.  Rest with your legs raised if you have leg cramps or low back pain.  Visit your dentist if you have not gone during your pregnancy. Use a soft toothbrush to brush your teeth. Be gentle when you floss.  You can have sex (intercourse) unless your doctor tells you not to.  Go to your doctor visits. Get help if:  You feel dizzy.  You have mild cramps or pressure in your lower belly (abdomen).  You have a nagging pain in your belly area.  You continue to feel sick to your stomach (nauseous), throw up (vomit), or have watery poop (diarrhea).  You have bad smelling fluid  coming from your vagina.  You have pain with peeing (urination). Get help right away if:  You have a fever.  You are leaking fluid from your vagina.  You have spotting or bleeding from your vagina.  You have severe belly cramping or pain.  You lose or gain weight rapidly.  You have trouble catching your breath and have chest pain.  You notice sudden or extreme puffiness (swelling) of your face, hands, ankles, feet, or legs.  You have not felt the baby move in over an hour.  You have severe headaches that do not go away with medicine.  You have vision changes. This information is not intended to replace advice given to you by your health care provider. Make sure you discuss any questions you have with your health care provider. Document Released: 04/09/2009 Document Revised: 06/21/2015 Document Reviewed: 03/16/2012 Elsevier Interactive Patient Education  2017 [employer]

## 2016-03-04 NOTE — Progress Notes (Signed)
   PRENATAL VISIT NOTE  Subjective:  Jeanne Duran is a 21 y.o. G1P0 at 315w2d being seen today for ongoing prenatal care.  She is currently monitored for the following issues for this low-risk pregnancy and has ADHD (attention deficit hyperactivity disorder); Supervision of normal pregnancy; History of kidney stones; and Scoliosis on her problem list.  Patient reports no complaints.  Occasional round ligament pain.  Contractions: Not present. Vag. Bleeding: None.  Movement: Present. Denies leaking of fluid.   The following portions of the patient's history were reviewed and updated as appropriate: allergies, current medications, past family history, past medical history, past social history, past surgical history and problem list. Problem list updated.  Objective:   Vitals:   03/04/16 0905  BP: (!) 101/59  Pulse: 72  Weight: 132 lb 6.4 oz (60.1 kg)    Fetal Status: Fetal Heart Rate (bpm): 154   Movement: Present     General:  Alert, oriented and cooperative. Patient is in no acute distress.  Skin: Skin is warm and dry. No rash noted.   Cardiovascular: Normal heart rate noted  Respiratory: Normal respiratory effort, no problems with respiration noted  Abdomen: Soft, gravid, appropriate for gestational age. Pain/Pressure: Absent     Pelvic:  Cervical exam deferred        Extremities: Normal range of motion.  Edema: None  Mental Status: Normal mood and affect. Normal behavior. Normal judgment and thought content.   Assessment and Plan:  Pregnancy: G1P0 at 695w2d  1. 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 Please refer to After Visit Summary for other counseling recommendations.   RTO 4 weeks  Jeanne Duran, CNM

## 2016-03-04 NOTE — Progress Notes (Signed)
Discussed weight gain with pt.

## 2016-03-10 ENCOUNTER — Ambulatory Visit (HOSPITAL_COMMUNITY)
Admission: RE | Admit: 2016-03-10 | Discharge: 2016-03-10 | Disposition: A | Discharge status: Home / Self Care | Payer: Managed Care, Other (non HMO) | Source: Ambulatory Visit | Attending: Advanced Practice Midwife | Admitting: Advanced Practice Midwife

## 2016-03-10 ENCOUNTER — Encounter (HOSPITAL_COMMUNITY): Payer: Self-pay

## 2016-03-10 DIAGNOSIS — Z3A22 22 weeks gestation of pregnancy: Secondary | ICD-10-CM | POA: Insufficient documentation

## 2016-03-10 DIAGNOSIS — Z362 Encounter for other antenatal screening follow-up: Secondary | ICD-10-CM | POA: Diagnosis not present

## 2016-03-10 DIAGNOSIS — O283 Abnormal ultrasonic finding on antenatal screening of mother: Secondary | ICD-10-CM

## 2016-03-10 DIAGNOSIS — Z315 Encounter for genetic counseling: Secondary | ICD-10-CM | POA: Diagnosis not present

## 2016-03-10 DIAGNOSIS — O358XX0 Maternal care for other (suspected) fetal abnormality and damage, not applicable or unspecified: Secondary | ICD-10-CM | POA: Insufficient documentation

## 2016-03-10 DIAGNOSIS — Z3402 Encounter for supervision of normal first pregnancy, second trimester: Secondary | ICD-10-CM

## 2016-03-10 NOTE — Progress Notes (Signed)
Genetic Counseling  High-Risk Gestation Note  Appointment Date:  03/10/2016 Referred By: Seabron Spates, CNM Date of Birth:  12/30/1995  Partner: Jeanne Duran, Jeanne Duran.   Pregnancy History: G1P0 Estimated Date of Delivery: 07/12/16 Estimated Gestational Age: 47w2dAttending: MRenella Cunas MD   I met with Ms. Jeanne Leiteand her mother, Jeanne Duran for genetic counseling because of abnormal ultrasound findings.   In summary:  Discussed ultrasound findings in detail  Bilateral club foot visualized; remaining visualized fetal anatomy within normal limits  Reviewed options for additional screening  NIPS-declined  Expanded carrier screening - declined  Reviewed options for diagnostic testing, including risks, benefits, limitations and alternatives- declined amniocentesis  Reviewed other explanations for ultrasound findings: isolated, multifactorial, neurological, chromosomal, single gene  Option of peds orthopedic prenatal consult- patient is interested in [facility] through [facility] we will help facilitate referral  Reviewed family history concerns  Patient reported multiple relatives with in-toeing  We began by reviewing the ultrasound in detail. Ultrasound today visualized bilateral club foot. Remaining visualized fetal anatomy was within normal range. Complete ultrasound results reported under separate cover.   We discussed that clubfoot/feet is a term that actually describes three distinct anomalies (talipes equinovarus, talipes calcaneovalgus, and metatarsus varus) and occurs in 1 in 1000 births. The most common type of clubfeet, talipes equinovarus, is characterized by forefoot adduction with supination, heel varus, and ankle equines, which cannot be brought back to a neutral position. They were counseled that clubfeet can be an isolated difference, occur as a feature of an underlying syndrome, or the result of neurological impairment. We discussed that the most likely mode of  inheritance for nonsyndromic, isolated clubfoot/feet is multifactorial. There is a known genetic component for multifactorial clubfoot, as demonstrated by twin studies; environmental factors also play a role, including infection, drugs, and intrauterine environment (oligohydramnios, fetal positioning). While the majority of cases of clubfoot/feet are isolated, it is a feature in more than 200 known genetic syndromes, including both chromosomal and single gene conditions. We reviewed chromosomes, nondisjunction and the features of Down syndrome, trisomy 181 and 128 We discussed that the risk for other chromosome aberrations is slightly increased (microdeletions, microduplications, insertions, translocations). We reviewed single gene conditions including common inheritance patterns and associated risks for recurrence.  Jeanne Duran was within normal range and reduced the risks for fetal Down syndrome, trisomy 18 and ONTDs.   [name] counseled regarding the option of noninvasive prenatal screening (NIPS)/cell free DNA (cfDNA) testing. We reviewed that although highly specific and sensitive, this testing is not considered to be diagnostic and does not detect all chromosome aberrations. We reviewed the detection and false positive rates of NIPS ([facility]) as well as the associated cost. We then discussed the option of amniocentesis, including the limitations, benefits, and risks. They understand that amniocentesis allows evaluation of the fetal chromosomes, but cannot detect all genetic conditions. Specifically, we discussed that single gene conditions are difficult to diagnose prenatally unless a specific condition is suspected based on additional ultrasound findings or family history. Additionally, we discussed the availability of microarray analysis, which can be performed pre and postnatally.  After thoughtful consideration, she declined NIPS and amniocentesis.   Regarding single gene  conditions, we discussed the option of carrier screening for a select panel of autosomal recessive and X-linked conditions via expanded pan-ethnic carrier screening panel. We reviewed risks, benefits, and limitations of carrier screening. [name] expanded carrier screening at this time.   We  discussed the option of meeting with a pediatric orthopedic specialist to discuss expectant management and treatment of clubfeet. They would like to pursue treatment [facility] their child at [facility].  We will facilitate a prenatal consult with orthopedics during the third trimester of pregnancy for the patient.   Both family histories were reviewed and found to be contributory for two paternal aunts to the patient described to be "pigeon toed," and a paternal uncle described to be "pigeon toed and bowlegged." They reportedly did not require medical correction. This description is suggestive of in-toeing, which can be caused by metatarsus adductus, tibial torsion, increased femoral anteversion, and less commonly by neuromuscular diseases, disorders of the hip, and club foot. We discussed that the reported family history could support underlying multifactorial cause for the club foot in the current pregnancy, but they understand that this cannot be proven at this time.  Without further information regarding the provided family history, an accurate genetic risk cannot be calculated.   Additionally, the patient's father reportedly had multiple relatives with breast cancer: three sisters, his mother, and his paternal grandmother. They reported that his paternal grandmother had "the breast cancer trait," as did his sisters who had breast cancer. The patient's father reportedly tested negative for the breast cancer trait identified in his family. We do not have medical documentation confirming this report at this time. Further genetic counseling is warranted if more information is  obtained.  [name] was provided with written information regarding cystic fibrosis (CF), spinal muscular atrophy (SMA) and hemoglobinopathies including the carrier frequency, availability of carrier screening and prenatal diagnosis if indicated.  In addition, we discussed that CF and hemoglobinopathies are routinely screened for as part of the [facility] newborn screening panel.  She declined screening for CF, SMA and hemoglobinopathies..   Ms. Anvi Mangal denied exposure to environmental toxins or chemical agents. She denied the use of alcohol, tobacco or street drugs. She denied significant viral illnesses during the course of her pregnancy. Her medical and surgical histories were noncontributory.   I counseled Ms. Lysha Schrade regarding the above risks and available options.  The approximate face-to-face time with the genetic counselor was 40 minutes.  Chipper Oman, MS Certified Genetic Counselor 03/10/2016

## 2016-03-20 ENCOUNTER — Telehealth (HOSPITAL_COMMUNITY): Payer: Self-pay | Admitting: MS"

## 2016-03-20 NOTE — Telephone Encounter (Signed)
Lenon CurtAmanda French at [facility] returned my call regarding prenatal consultation for Mertie MooresEbony Elza with [facility]. In the message, Ms. JamaicaFrench stated that she contacted the patient and that the nurse clinician [facility] will be in contact with the patient in the next two weeks.   Clydie BraunKaren Shelaine Frie 03/20/2016 3:54 PM

## 2016-03-31 ENCOUNTER — Encounter: Payer: Managed Care, Other (non HMO) | Admitting: Medical

## 2016-04-06 ENCOUNTER — Encounter (HOSPITAL_COMMUNITY): Payer: Self-pay | Admitting: *Deleted

## 2016-04-06 ENCOUNTER — Inpatient Hospital Stay (HOSPITAL_COMMUNITY)
Admission: AD | Admit: 2016-04-06 | Discharge: 2016-04-07 | Disposition: A | Discharge status: Home / Self Care | Payer: Managed Care, Other (non HMO) | Source: Ambulatory Visit | Attending: Obstetrics & Gynecology | Admitting: Obstetrics & Gynecology

## 2016-04-06 DIAGNOSIS — R69 Illness, unspecified: Secondary | ICD-10-CM

## 2016-04-06 DIAGNOSIS — M791 Myalgia: Secondary | ICD-10-CM | POA: Diagnosis present

## 2016-04-06 DIAGNOSIS — Z3402 Encounter for supervision of normal first pregnancy, second trimester: Secondary | ICD-10-CM

## 2016-04-06 DIAGNOSIS — J111 Influenza due to unidentified influenza virus with other respiratory manifestations: Secondary | ICD-10-CM | POA: Diagnosis not present

## 2016-04-06 DIAGNOSIS — O99512 Diseases of the respiratory system complicating pregnancy, second trimester: Secondary | ICD-10-CM | POA: Diagnosis not present

## 2016-04-06 DIAGNOSIS — Z3A26 26 weeks gestation of pregnancy: Secondary | ICD-10-CM | POA: Diagnosis not present

## 2016-04-06 DIAGNOSIS — O283 Abnormal ultrasonic finding on antenatal screening of mother: Secondary | ICD-10-CM

## 2016-04-06 LAB — URINALYSIS, ROUTINE W REFLEX MICROSCOPIC
Bilirubin Urine: NEGATIVE
GLUCOSE, UA: NEGATIVE mg/dL
Hgb urine dipstick: NEGATIVE
Ketones, ur: NEGATIVE mg/dL
LEUKOCYTES UA: NEGATIVE
NITRITE: NEGATIVE
PH: 7 (ref 5.0–8.0)
PROTEIN: NEGATIVE mg/dL
Specific Gravity, Urine: 1.01 (ref 1.005–1.030)

## 2016-04-06 LAB — INFLUENZA PANEL BY PCR (TYPE A & B)
INFLBPCR: NEGATIVE
Influenza A By PCR: NEGATIVE

## 2016-04-06 MED ORDER — OSELTAMIVIR PHOSPHATE 75 MG PO CAPS: 75.0000 mg | ORAL_CAPSULE | Freq: Two times a day (BID) | ORAL | 0 refills | Status: AC

## 2016-04-06 MED ORDER — ACETAMINOPHEN 500 MG PO TABS
1000.0000 mg | ORAL_TABLET | Freq: Once | ORAL | Status: AC
  Administered 2016-04-06: 1000 mg via ORAL
  Filled 2016-04-06: qty 2

## 2016-04-06 NOTE — MAU Provider Note (Signed)
Chief Complaint: Nasal Congestion and Fever   First Provider Initiated Contact with Patient 04/06/16 2348     SUBJECTIVE HPI: Jeanne Duran is a 21 y.o. G1P0 at [redacted]w[redacted]d who presents to Maternity Admissions reporting diffuse muscle pain and temperature 102F. Vomiting yesterday w/o diarrhea now resolved. Received annual flu shot. Denies sick contacts. Has h/a but has not tried tylenol. Able to tolerate fluids over last 24 hours. Denies contractions, leakage of fluid or vaginal bleeding. Good fetal movement.   Location: all over Quality: ache Severity: 10/10 in pain scale Duration: consatnt Context: URI symtpoms Timing: yesterday Modifying factors: none Associated signs and symptoms: se above   Past Medical History:  Diagnosis Date  . Kidney stones   . Scoliosis   . Scoliosis    OB History  Gravida Para Term Preterm AB Living  1            SAB TAB Ectopic Multiple Live Births               # Outcome Date GA Lbr Len/2nd Weight Sex Delivery Anes PTL Lv  1 Current              History reviewed. No pertinent surgical history. Family History  Problem Relation Age of Onset  . Thyroid cancer Mother   . Diabetes Mother   . Diabetes Father   . Hypertension Father    Social History  Substance Use Topics  . Smoking status: Never Smoker  . Smokeless tobacco: Never Used  . Alcohol use No   No Known Allergies Prescriptions Prior to Admission  Medication Sig Dispense Refill Last Dose  . Prenatal Vit-Fe Fumarate-FA (PRENATAL VITAMINS) 28-0.8 MG TABS Take 1 tablet by mouth daily. 30 tablet 9 Taking    I have reviewed patient's Past Medical Hx, Surgical Hx, Family Hx, Social Hx, medications and allergies.   ROS:  Review of Systems  Constitutional: Positive for fatigue and fever. Negative for chills and diaphoresis.  HENT: Positive for congestion, rhinorrhea and sore throat. Negative for sinus pain, sinus pressure and sneezing.   Eyes: Negative for photophobia and visual disturbance.   Respiratory: Negative for cough, shortness of breath and wheezing.   Cardiovascular: Negative for chest pain, palpitations and leg swelling.  Gastrointestinal: Positive for nausea and vomiting. Negative for abdominal pain, constipation and diarrhea.  Genitourinary: Negative for dysuria, flank pain, frequency, hematuria, urgency, vaginal bleeding, vaginal discharge and vaginal pain.  Musculoskeletal: Positive for myalgias. Negative for back pain and neck pain.  Neurological: Positive for headaches. Negative for light-headedness.    Physical Exam   Patient Vitals for the past 24 hrs:  BP Temp Temp src Pulse Resp  04/07/16 0010 99/56 - - 94 16  04/06/16 2244 118/68 98.8 F (37.1 C) Oral 111 18   Constitutional: Well-developed, well-nourished female in no acute distress.  Cardiovascular: normal rate Respiratory: normal effort GI: Abd soft, non-tender, gravid appropriate for gestational age. Pos BS x 4 MS: Extremities nontender, no edema, normal ROM Neurologic: Alert and oriented x 4.  GU: Neg CVAT.  Pelvic: NEFG, physiologic discharge, no blood, cervix clean. No CMT     FHT:  Baseline 155 , moderate variability, accelerations present, no decelerations Contractions: none   Labs: Results for orders placed or performed during the hospital encounter of 04/06/16 (from the past 24 hour(s))  Urinalysis, Routine w reflex microscopic     Status: None   Collection Time: 04/06/16 10:37 PM  Result Value Ref Range   Color, Urine YELLOW  YELLOW   APPearance CLEAR CLEAR   Specific Gravity, Urine 1.010 1.005 - 1.030   pH 7.0 5.0 - 8.0   Glucose, UA NEGATIVE NEGATIVE mg/dL   Hgb urine dipstick NEGATIVE NEGATIVE   Bilirubin Urine NEGATIVE NEGATIVE   Ketones, ur NEGATIVE NEGATIVE mg/dL   Protein, ur NEGATIVE NEGATIVE mg/dL   Nitrite NEGATIVE NEGATIVE   Leukocytes, UA NEGATIVE NEGATIVE    Imaging:  [place] Mfm Ob Follow Up  Result Date:  03/10/2016 ----------------------------------------------------------------------  OBSTETRICS REPORT                      (Signed Final 03/10/2016 04:17 pm) ---------------------------------------------------------------------- Patient Info  ID #:       [id number]                         D.O.B.:   Apr 19, 1995 (20 yrs)  Name:       Jeanne Duran                    Visit Date:  03/10/2016 08:20 am ---------------------------------------------------------------------- Performed By  Performed By:     Tommi Emery         Ref. Address:     [facility]                                                             OB/Gyn Clinic                                                             [address]                                                             [place], [place]                                                             [address]  Attending:        Particia Nearing MD       Location:         [facility]  Referred By:      [facility]  Center for                    [facility]                    Healthcare ---------------------------------------------------------------------- Orders   #  Description                                 Code   1  [place] MFM OB FOLLOW UP                         [id number]  ----------------------------------------------------------------------   #  Ordered By               Order #        Accession #    Episode #   1  Wynelle Bourgeois           [id number]      [id number]     [id number]  ---------------------------------------------------------------------- Indications   [redacted] weeks gestation of pregnancy                Z3A.22   Club foot; low risk QUAD screen                O35.8XX0   Encounter for other antenatal screening        Z36.2   follow-up  ---------------------------------------------------------------------- OB History  Gravidity:    1  ---------------------------------------------------------------------- Fetal Evaluation  Num Of Fetuses:     1  Fetal Heart         151  Rate(bpm):  Cardiac Activity:   Observed  Presentation:       Transverse, head to maternal right  Placenta:           Anterior, above cervical os  P. Cord Insertion:  Visualized  Amniotic Fluid  AFI FV:      Subjectively within normal limits                              Largest Pocket(cm)                              5.71 ---------------------------------------------------------------------- Biometry  BPD:      53.7  mm     G. Age:  22w 2d         48  %    CI:        75.96   %   70 - 86                                                          FL/HC:      20.1   %   18.4 - 20.2  HC:      195.3  mm     G. Age:  21w 5d         19  %    HC/AC:      1.06       1.06 - 1.25  AC:      184.3  mm     G. Age:  23w 2d  72  %    FL/BPD:     73.0   %   71 - 87  FL:       39.2  mm     G. Age:  22w 4d         50  %    FL/AC:      21.3   %   20 - 24  HUM:      38.2  mm     G. Age:  23w 4d         76  %  CER:      25.1  mm     G. Age:  23w 1d         62  %  Est. FW:     537  gm      1 lb 3 oz     57  % ---------------------------------------------------------------------- Gestational Age  LMP:           19w 2d       Date:   10/27/15                 EDD:   08/02/16  U/S Today:     22w 3d                                        EDD:   07/11/16  Best:          22w 2d    Det. ByMarcella Dubs:   Early Ultrasound         EDD:   07/12/16                                      (12/04/15) ---------------------------------------------------------------------- Anatomy  Cranium:               Appears normal         Aortic Arch:            Previously seen  Cavum:                 Appears normal         Ductal Arch:            Appears normal  Ventricles:            Appears normal         Diaphragm:              Appears normal  Choroid Plexus:        Appears normal         Stomach:                Appears normal, left                                                                         sided  Cerebellum:            Appears normal         Abdomen:  Appears normal  Posterior Fossa:       Appears normal         Abdominal Wall:         Previously seen  Nuchal Fold:           Previously seen        Cord Vessels:           Appears normal (3                                                                        vessel cord)  Face:                  Orbits and profile     Kidneys:                Appear normal                         previously seen  Lips:                  Previously seen        Bladder:                Appears normal  Palate:                Previously seen        Spine:                  Limited views                                                                        appear normal  Heart:                 Appears normal         Upper Extremities:      Previously seen                         (4CH, axis, and situs  RVOT:                  Appears normal         Lower Extremities:      Clubfeet  LVOT:                  Appears normal  Other:  Nasal bone visualized. Fetus appears to be a female. ---------------------------------------------------------------------- Cervix Uterus Adnexa  Cervix  Length:           3.99  cm.  Normal appearance by transabdominal scan.  Uterus  No abnormality visualized.  Left Ovary  Not visualized. No adnexal mass visualized.  Right Ovary  Not visualized. No adnexal mass visualized. 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 Manson Passey received genetic counseling today. Please see  note. ---------------------------------------------------------------------- Recommendations  Follow-up as clinically  indicated ----------------------------------------------------------------------                 Particia Nearing, MD Electronically Signed Final Report   03/10/2016 04:17 pm ----------------------------------------------------------------------   MAU Course: Orders Placed This Encounter  Procedures  . Urinalysis, Routine w reflex microscopic  . Influenza panel by PCR (type A & B)  . Droplet precaution   Meds ordered this encounter  Medications  . acetaminophen (TYLENOL) tablet 1,000 mg  . oseltamivir (TAMIFLU) 75 MG capsule    Sig: Take 1 capsule (75 mg total) by mouth 2 (two) times daily.    Dispense:  10 capsule    Refill:  0    Assessment & Plan: 1. Influenza-like illness   2. Abnormal fetal ultrasound   3. Encounter for supervision of normal first pregnancy in second trimester    Flu-like symptoms. Vomiting resolved with well-appearing state. Given tylenol for h/a w/ improvement. Flu pending. Fetal strip reassuring. --Tamiflu Rx sent and will call pt for flu swab result --Encouraged to maintain adequate fluid intake --Given list for safe meds in pregnancy --Labor precautions and fetal kick counts reviewed --Discharge home in stable condition  Follow-up Information    RASCH, Iona Hansen, NP. Go on 04/09/2016.   Specialty:  Obstetrics and Gynecology Why:  Go to appt at 9:40 AM. Contact information: [address] [place] [address] [phone]           Allergies as of 04/06/2016   No Known Allergies     Medication List    TAKE these medications   oseltamivir 75 MG capsule Commonly known as:  TAMIFLU Take 1 capsule (75 mg total) by mouth 2 (two) times daily.   Prenatal Vitamins 28-0.8 MG Tabs Take 1 tablet by mouth daily.       Wendee Beavers, DO, PGY-1 04/06/2016, 11:49 PM   I was present for the exam and agree with above. Flu PCR negative. Tamiflu not needed. Discussed OTC meds and comfort measures. MAU as needed in  emergencies.  [place], CNM 04/07/2016 1:15 AM

## 2016-04-06 NOTE — Discharge Instructions (Signed)
SAFE MEDICATIONS IN PREGNANCY  Acne:  Benzoyl Peroxide  Salicylic Acid   Backache/Headache:  Tylenol:  2 Regular strength every 4 hours OR        2 Extra strength every 6 hours   Colds/Coughs/Allergies:  Benadryl (alcohol free) 25 mg every 6 hours as needed  Breath right strips  Claritin  Cepacol throat lozenges  Chloraseptic throat spray  Cold-Eeze- up to three times per day  Cough drops, alcohol free  Flonase (by prescription only)  Guaifenesin  Mucinex  Robitussin DM (plain only, alcohol free)  Saline nasal spray/drops  Sudafed (pseudoephedrine) & Actifed * use only after [redacted] weeks gestation and if you do not have high blood pressure  Tylenol  Vicks Vaporub  Zinc lozenges  Zyrtec   Constipation:  Colace  Ducolax suppositories  Fleet enema  Glycerin suppositories  Metamucil  Milk of magnesia  Miralax  Senokot  Smooth move tea   Diarrhea:  Kaopectate  Imodium A-D   *NO pepto Bismol   Hemorrhoids:  Anusol  Anusol HC  Preparation H  Tucks   Indigestion:  Tums  Maalox  Mylanta  Zantac  Pepcid   Insomnia:  Benadryl (alcohol free) 25mg  every 6 hours as needed  Tylenol PM  Unisom, no Gelcaps   Leg Cramps:  Tums  MagGel   Nausea/Vomiting:  Bonine  Dramamine  Emetrol  Ginger extract  Sea bands  Meclizine  Nausea medication to take during pregnancy:  Unisom (doxylamine succinate 25 mg tablets) Take one tablet daily at bedtime. If symptoms are not adequately controlled, the dose can be increased to a maximum recommended dose of two tablets daily (1/2 tablet in the morning, 1/2 tablet mid-afternoon and one at bedtime).  Vitamin B6 100mg  tablets. Take one tablet twice a day (up to 200 mg per day).   Skin Rashes:  Aveeno products  Benadryl cream or 25mg  every 6 hours as needed  Calamine Lotion  1% cortisone cream   Yeast infection:  Gyne-lotrimin 7  Monistat 7    **If taking multiple medications, please check labels to avoid  duplicating the same active ingredients  **Take medication as directed on the label  **Do not exceed 4000 mg of tylenol in 24 hours  **Do not take medications that contain aspirin or ibuprofen     Pregnancy and Influenza Influenza, also called the flu, is an infection of the respiratory tract. If you are pregnant, you are more likely to catch the flu. You are also more likely to have a more serious case of the flu. This is because pregnancy lowers your bodys ability to fight off infections (it weakens your immune system). It also puts additional stress on your heart and lungs, which makes you more likely to have complications. Having a bad case of the flu, especially with a high fever, can be dangerous for your developing baby. It can cause you to go into early labor. How do people get the flu? The flu is caused by the influenza virus. This virus is common every year in the fall and winter. It spreads when virus particles get passed from person to person. You can get the virus if you are near a sick person who is coughing or sneezing. You can also get the virus if you touch something that has the virus on it and then touch your face. How can I protect myself against the flu?  Get a flu shot. The best way to prevent the flu is to get a flu shot  before flu season starts. The flu shot is not dangerous for your developing baby. It may even help protect your baby from the flu for up to 6 months after birth. The flu shot is one type of flu vaccine. Another type is a nasal spray vaccine. Do not get the nasal spray vaccine. It is not approved for pregnancy.  Do not come in close contact with sick people.  Do not share food, drinks, or utensils with other people.  Wash your hands often. Use hand sanitizer when soap and water are not available. What should I do if I have flu symptoms? If you have any flu symptoms, call your health care provider right away. Flu symptoms include:  Fever or  chills.  Muscle aches.  Headache.  Sore throat.  Nasal congestion.  Cough.  Feeling tired.  Loss of appetite.  Vomiting.  Diarrhea. You may be able to take an antiviral medicine to keep the flu from becoming severe and to shorten how long it lasts. What should I do at home if I am diagnosed with the flu?  Do not take any medicine, including cold or flu medicine, unless directed by your health care provider.  If you take antiviral medicine, make sure you finish it even if you start to feel better.  Drink enough fluid to keep your urine clear or pale yellow.  Get plenty of rest. When would I seek immediate medical care if I have the flu?  You have trouble breathing.  You have chest pain.  You begin to have labor pains.  You have a high fever that does not go down after you take medicine.  You do not feel your baby move.  You have diarrhea or vomiting that will not go away. This information is not intended to replace advice given to you by your health care provider. Make sure you discuss any questions you have with your health care provider. Document Released: 11/16/2007 Document Revised: 06/21/2015 Document Reviewed: 12/10/2012 Elsevier Interactive Patient Education  2017 [employer]

## 2016-04-06 NOTE — MAU Note (Signed)
Pt presents complaining of a stuffy nose and fever that started this am. Denies bleeding or leaking. Reports good fetal movement. Denies contractions. All over achy.

## 2016-04-07 ENCOUNTER — Encounter: Payer: Managed Care, Other (non HMO) | Admitting: Medical

## 2016-04-07 DIAGNOSIS — J111 Influenza due to unidentified influenza virus with other respiratory manifestations: Secondary | ICD-10-CM | POA: Diagnosis not present

## 2016-04-09 ENCOUNTER — Ambulatory Visit (INDEPENDENT_AMBULATORY_CARE_PROVIDER_SITE_OTHER): Payer: Managed Care, Other (non HMO) | Admitting: Obstetrics and Gynecology

## 2016-04-09 VITALS — BP 106/75 | HR 72 | Wt 138.5 lb

## 2016-04-09 DIAGNOSIS — Z3402 Encounter for supervision of normal first pregnancy, second trimester: Secondary | ICD-10-CM

## 2016-04-09 DIAGNOSIS — Z23 Encounter for immunization: Secondary | ICD-10-CM | POA: Diagnosis not present

## 2016-04-09 DIAGNOSIS — O283 Abnormal ultrasonic finding on antenatal screening of mother: Secondary | ICD-10-CM

## 2016-04-09 MED ORDER — TETANUS-DIPHTH-ACELL PERTUSSIS 5-2.5-18.5 LF-MCG/0.5 IM SUSP
0.5000 mL | Freq: Once | INTRAMUSCULAR | Status: AC
  Administered 2016-04-09: 0.5 mL via INTRAMUSCULAR

## 2016-04-09 NOTE — Progress Notes (Signed)
   PRENATAL VISIT NOTE  Subjective:  Jeanne Duran is a 21 y.o. G1P0 at 5982w4d being seen today for ongoing prenatal care.  She is currently monitored for the following issues for this low-risk pregnancy and has ADHD (attention deficit hyperactivity disorder); Supervision of normal pregnancy; History of kidney stones; Scoliosis; and Abnormal fetal ultrasound on her problem list.  Patient reports no complaints.  Contractions: Not present.  .  Movement: Present. Denies leaking of fluid.   The following portions of the patient's history were reviewed and updated as appropriate: allergies, current medications, past family history, past medical history, past social history, past surgical history and problem list. Problem list updated.  Objective:   Vitals:   04/09/16 0956  BP: 106/75  Pulse: 72  Weight: 138 lb 8 oz (62.8 kg)    Fetal Status: Fetal Heart Rate (bpm): 145 Fundal Height: 26 cm Movement: Present    General:  Alert, oriented and cooperative. Patient is in no acute distress.  Skin: Skin is warm and dry. No rash noted.   Cardiovascular: Normal heart rate noted  Respiratory: Normal respiratory effort, no problems with respiration noted  Abdomen: Soft, gravid, appropriate for gestational age. Pain/Pressure: Absent     Pelvic:  Cervical exam deferred   Extremities: Normal range of motion.  Edema: None  Mental Status: Normal mood and affect. Normal behavior. Normal judgment and thought content.   Assessment and Plan:  Pregnancy: G1P0 at 6682w4d  1. Encounter for supervision of normal first pregnancy in second trimester  - TDAP today.  - Discussed 2 hour GTT at next visit.   2. Abnormal fetal ultrasound  Patient coping well. No questions at this time. Feels well prepared and informed.    Preterm labor symptoms and general obstetric precautions including but not limited to vaginal bleeding, contractions, leaking of fluid and fetal movement were reviewed in detail with the  patient. Please refer to After Visit Summary for other counseling recommendations.  Return in about 2 weeks (around 04/23/2016) For 2 hour GTT.   Duane LopeJennifer I Raea Magallon, NP

## 2016-04-09 NOTE — Patient Instructions (Signed)
DTaP Vaccine (Diphtheria, Tetanus, and Pertussis): What You Need to Know 1. Why get vaccinated? Diphtheria, tetanus, and pertussis are serious diseases caused by bacteria. Diphtheria and pertussis are spread from person to person. Tetanus enters the body through cuts or wounds. DIPHTHERIA causes a thick covering in the back of the throat.  It can lead to breathing problems, paralysis, heart failure, and even death.  TETANUS (Lockjaw) causes painful tightening of the muscles, usually all over the body.  It can lead to "locking" of the jaw so the victim cannot open his mouth or swallow. Tetanus leads to death in up to 2 out of 10 cases.  PERTUSSIS (Whooping Cough) causes coughing spells so bad that it is hard for infants to eat, drink, or breathe. These spells can last for weeks.  It can lead to pneumonia, seizures (jerking and staring spells), brain damage, and death.  Diphtheria, tetanus, and pertussis vaccine (DTaP) can help prevent these diseases. Most children who are vaccinated with DTaP will be protected throughout childhood. Many more children would get these diseases if we stopped vaccinating. DTaP is a safer version of an older vaccine called DTP. DTP is no longer used in the [place]. 2. Who should get DTaP vaccine and when? Children should get 5 doses of DTaP vaccine, one dose at each of the following ages:  2 months  4 months  6 months  15-18 months  4-6 years  DTaP may be given at the same time as other vaccines. 3. Some children should not get DTaP vaccine or should wait  Children with minor illnesses, such as a cold, may be vaccinated. But children who are moderately or severely ill should usually wait until they recover before getting DTaP vaccine.  Any child who had a life-threatening allergic reaction after a dose of DTaP should not get another dose.  Any child who suffered a brain or nervous system disease within 7 days after a dose of DTaP should not get  another dose.  Talk with your doctor if your child: ? had a seizure or collapsed after a dose of DTaP, ? cried non-stop for 3 hours or more after a dose of DTaP, ? had a fever over 105F after a dose of DTaP. Ask your doctor for more information. Some of these children should not get another dose of pertussis vaccine, but may get a vaccine without pertussis, called DT. 4. Older children and adults DTaP is not licensed for adolescents, adults, or children 7 years of age and older. But older people still need protection. A vaccine called Tdap is similar to DTaP. A single dose of Tdap is recommended for people 11 through 23 years of age. Another vaccine, called Td, protects against tetanus and diphtheria, but not pertussis. It is recommended every 10 years. There are separate Vaccine Information Statements for these vaccines. 5. What are the risks from DTaP vaccine? Getting diphtheria, tetanus, or pertussis disease is much riskier than getting DTaP vaccine. However, a vaccine, like any medicine, is capable of causing serious problems, such as severe allergic reactions. The risk of DTaP vaccine causing serious harm, or death, is extremely small. Mild problems (common)  Fever (up to about 1 child in 4)  Redness or swelling where the shot was given (up to about 1 child in 4)  Soreness or tenderness where the shot was given (up to about 1 child in 4) These problems occur more often after the 4th and 5th doses of the DTaP series than after   earlier doses. Sometimes the 4th or 5th dose of DTaP vaccine is followed by swelling of the entire arm or leg in which the shot was given, lasting 1-7 days (up to about 1 child in 30). Other mild problems include:  Fussiness (up to about 1 child in 3)  Tiredness or poor appetite (up to about 1 child in 10)  Vomiting (up to about 1 child in 50) These problems generally occur 1-3 days after the shot. Moderate problems (uncommon)  Seizure (jerking or staring)  (about 1 child out of 14,000)  Non-stop crying, for 3 hours or more (up to about 1 child out of 1,000)  High fever, over 105F (about 1 child out of 16,000) Severe problems (very rare)  Serious allergic reaction (less than 1 out of a million doses)  Several other severe problems have been reported after DTaP vaccine. These include: ? Long-term seizures, coma, or lowered consciousness ? Permanent brain damage. These are so rare it is hard to tell if they are caused by the vaccine. Controlling fever is especially important for children who have had seizures, for any reason. It is also important if another family member has had seizures. You can reduce fever and pain by giving your child an aspirin-free pain reliever when the shot is given, and for the next 24 hours, following the package instructions. 6. What if there is a serious reaction? What should I look for? Look for anything that concerns you, such as signs of a severe allergic reaction, very high fever, or behavior changes. Signs of a severe allergic reaction can include hives, swelling of the face and throat, difficulty breathing, a fast heartbeat, dizziness, and weakness. These would start a few minutes to a few hours after the vaccination. What should I do?  If you think it is a severe allergic reaction or other emergency that can't wait, call 9-1-1 or get the person to the nearest hospital. Otherwise, call your doctor.  Afterward, the reaction should be reported to the Vaccine Adverse Event Reporting System (VAERS). Your doctor might file this report, or you can do it yourself through the VAERS web site at www.vaers.hhs.gov, or by calling 1-800-822-7967. ? VAERS is only for reporting reactions. They do not give medical advice. 7. The National Vaccine Injury Compensation Program The National Vaccine Injury Compensation Program (VICP) is a federal program that was created to compensate people who may have been injured by certain  vaccines. Persons who believe they may have been injured by a vaccine can learn about the program and about filing a claim by calling 1-800-338-2382 or visiting the VICP website at www.hrsa.gov/vaccinecompensation. 8. How can I learn more?  Ask your doctor.  Call your local or state health department.  Contact the Centers for Disease Control and Prevention (CDC): ? Call 1-800-232-4636 (1-800-CDC-INFO) or ? Visit CDC's website at www.cdc.gov/vaccines CDC DTaP Vaccine (Diphtheria, Tetanus, and Pertussis) VIS (06/12/05) This information is not intended to replace advice given to you by your health care provider. Make sure you discuss any questions you have with your health care provider. Document Released: 11/10/2005 Document Revised: 10/04/2015 Document Reviewed: 10/04/2015 Elsevier Interactive Patient Education  2017 Elsevier Inc.  

## 2016-04-21 ENCOUNTER — Ambulatory Visit (INDEPENDENT_AMBULATORY_CARE_PROVIDER_SITE_OTHER): Payer: Managed Care, Other (non HMO) | Admitting: Medical

## 2016-04-21 VITALS — BP 99/60 | HR 87 | Wt 141.9 lb

## 2016-04-21 DIAGNOSIS — Z3403 Encounter for supervision of normal first pregnancy, third trimester: Secondary | ICD-10-CM

## 2016-04-21 DIAGNOSIS — Z23 Encounter for immunization: Secondary | ICD-10-CM

## 2016-04-21 DIAGNOSIS — Z3493 Encounter for supervision of normal pregnancy, unspecified, third trimester: Secondary | ICD-10-CM

## 2016-04-21 NOTE — Progress Notes (Signed)
   PRENATAL VISIT NOTE  Subjective:  Mertie Mooresbony Caesar is a 21 y.o. G1P0 at 9078w2d being seen today for ongoing prenatal care.  She is currently monitored for the following issues for this low-risk pregnancy and has ADHD (attention deficit hyperactivity disorder); Supervision of normal pregnancy; History of kidney stones; Scoliosis; and Abnormal fetal ultrasound on her problem list.  Patient reports no complaints.  Contractions: Not present. Vag. Bleeding: None.  Movement: Present. Denies leaking of fluid.   The following portions of the patient's history were reviewed and updated as appropriate: allergies, current medications, past family history, past medical history, past social history, past surgical history and problem list. Problem list updated.  Objective:   Vitals:   04/21/16 0809  BP: 99/60  Pulse: 87  Weight: 141 lb 14.4 oz (64.4 kg)    Fetal Status: Fetal Heart Rate (bpm): 140 Fundal Height: 28 cm Movement: Present     General:  Alert, oriented and cooperative. Patient is in no acute distress.  Skin: Skin is warm and dry. No rash noted.   Cardiovascular: Normal heart rate noted  Respiratory: Normal respiratory effort, no problems with respiration noted  Abdomen: Soft, gravid, appropriate for gestational age. Pain/Pressure: Absent     Pelvic:  Cervical exam deferred        Extremities: Normal range of motion.  Edema: None  Mental Status: Normal mood and affect. Normal behavior. Normal judgment and thought content.   Assessment and Plan:  Pregnancy: G1P0 at 4178w2d  1. Encounter for supervision of normal pregnancy in third trimester, unspecified gravidity - Glucose Tolerance, 2 Hours w/1 Hour - HIV antibody (with reflex) - RPR - CBC  2. Need for Tdap vaccination - Tdap vaccine greater than or equal to 7yo IM  Preterm labor symptoms and general obstetric precautions including but not limited to vaginal bleeding, contractions, leaking of fluid and fetal movement were reviewed  in detail with the patient. Please refer to After Visit Summary for other counseling recommendations.  Return in about 4 weeks (around 05/19/2016) for LOB. Baby Scripts Optimization    Marny LowensteinJulie N Wenzel, PA-C

## 2016-04-21 NOTE — Addendum Note (Signed)
Addended by: Garret ReddishBARNES, Tyrell Brereton M on: 04/21/2016 12:48 PM   Modules accepted: Orders

## 2016-04-21 NOTE — Patient Instructions (Signed)
Braxton Hicks Contractions °Contractions of the uterus can occur throughout pregnancy, but they are not always a sign that you are in labor. You may have practice contractions called Braxton Hicks contractions. These false labor contractions are sometimes confused with true labor. °What are Braxton Hicks contractions? °Braxton Hicks contractions are tightening movements that occur in the muscles of the uterus before labor. Unlike true labor contractions, these contractions do not result in opening (dilation) and thinning of the cervix. Toward the end of pregnancy (32-34 weeks), Braxton Hicks contractions can happen more often and may become stronger. These contractions are sometimes difficult to tell apart from true labor because they can be very uncomfortable. You should not feel embarrassed if you go to the hospital with false labor. °Sometimes, the only way to tell if you are in true labor is for your health care provider to look for changes in the cervix. The health care provider will do a physical exam and may monitor your contractions. If you are not in true labor, the exam should show that your cervix is not dilating and your water has not broken. °If there are no prenatal problems or other health problems associated with your pregnancy, it is completely safe for you to be sent home with false labor. You may continue to have Braxton Hicks contractions until you go into true labor. °How can I tell the difference between true labor and false labor? °· Differences °¨ False labor °¨ Contractions last 30-70 seconds.: Contractions are usually shorter and not as strong as true labor contractions. °¨ Contractions become very regular.: Contractions are usually irregular. °¨ Discomfort is usually felt in the top of the uterus, and it spreads to the lower abdomen and low back.: Contractions are often felt in the front of the lower abdomen and in the groin. °¨ Contractions do not go away with walking.: Contractions may  go away when you walk around or change positions while lying down. °¨ Contractions usually become more intense and increase in frequency.: Contractions get weaker and are shorter-lasting as time goes on. °¨ The cervix dilates and gets thinner.: The cervix usually does not dilate or become thin. °Follow these instructions at home: °¨ Take over-the-counter and prescription medicines only as told by your health care provider. °¨ Keep up with your usual exercises and follow other instructions from your health care provider. °¨ Eat and drink lightly if you think you are going into labor. °¨ If Braxton Hicks contractions are making you uncomfortable: °¨ Change your position from lying down or resting to walking, or change from walking to resting. °¨ Sit and rest in a tub of warm water. °¨ Drink enough fluid to keep your urine clear or pale yellow. Dehydration may cause these contractions. °¨ Do slow and deep breathing several times an hour. °¨ Keep all follow-up prenatal visits as told by your health care provider. This is important. °Contact a health care provider if: °¨ You have a fever. °¨ You have continuous pain in your abdomen. °Get help right away if: °¨ Your contractions become stronger, more regular, and closer together. °¨ You have fluid leaking or gushing from your vagina. °¨ You pass blood-tinged mucus (bloody show). °¨ You have bleeding from your vagina. °¨ You have low back pain that you never had before. °¨ You feel your baby’s head pushing down and causing pelvic pressure. °¨ Your baby is not moving inside you as much as it used to. °Summary °¨ Contractions that occur before labor are   called Braxton Hicks contractions, false labor, or practice contractions. °¨ Braxton Hicks contractions are usually shorter, weaker, farther apart, and less regular than true labor contractions. True labor contractions usually become progressively stronger and regular and they become more frequent. °¨ Manage discomfort from  Braxton Hicks contractions by changing position, resting in a warm bath, drinking plenty of water, or practicing deep breathing. °This information is not intended to replace advice given to you by your health care provider. Make sure you discuss any questions you have with your health care provider. °Document Released: 01/13/2005 Document Revised: 12/03/2015 Document Reviewed: 12/03/2015 °Elsevier Interactive Patient Education © 2017 [employer] Inc[employer] °Fetal Movement Counts °Patient Name: ________________________________________________ Patient Due Date: ____________________ °What is a fetal movement count? °A fetal movement count is the number of times that you feel your baby move during a certain amount of time. This may also be called a fetal kick count. A fetal movement count is recommended for every pregnant woman. You may be asked to start counting fetal movements as early as week 28 of your pregnancy. °Pay attention to when your baby is most active. You may notice your baby's sleep and wake cycles. You may also notice things that make your baby move more. You should do a fetal movement count: °· When your baby is normally most active. °· At the same time each day. °A good time to count movements is while you are resting, after having something to eat and drink. °How do I count fetal movements? °1. Find a quiet, comfortable area. Sit, or lie down on your side. °2. Write down the date, the start time and stop time, and the number of movements that you felt between those two times. Take this information with you to your health care visits. °3. For 2 hours, count kicks, flutters, swishes, rolls, and jabs. You should feel at least 10 movements during 2 hours. °4. You may stop counting after you have felt 10 movements. °5. If you do not feel 10 movements in 2 hours, have something to eat and drink. Then, keep resting and counting for 1 hour. If you feel at least 4 movements during that hour, you may stop  counting. °Contact a health care provider if: °· You feel fewer than 4 movements in 2 hours. °· Your baby is not moving like he or she usually does. °Date: ____________ Start time: ____________ Stop time: ____________ Movements: ____________ °Date: ____________ Start time: ____________ Stop time: ____________ Movements: ____________ °Date: ____________ Start time: ____________ Stop time: ____________ Movements: ____________ °Date: ____________ Start time: ____________ Stop time: ____________ Movements: ____________ °Date: ____________ Start time: ____________ Stop time: ____________ Movements: ____________ °Date: ____________ Start time: ____________ Stop time: ____________ Movements: ____________ °Date: ____________ Start time: ____________ Stop time: ____________ Movements: ____________ °Date: ____________ Start time: ____________ Stop time: ____________ Movements: ____________ °Date: ____________ Start time: ____________ Stop time: ____________ Movements: ____________ °This information is not intended to replace advice given to you by your health care provider. Make sure you discuss any questions you have with your health care provider. °Document Released: 02/12/2006 Document Revised: 09/12/2015 Document Reviewed: 02/22/2015 °Elsevier Interactive Patient Education © 2017 [employer] Inc. ° °

## 2016-04-21 NOTE — Progress Notes (Signed)
28 week labs 

## 2016-04-22 ENCOUNTER — Other Ambulatory Visit: Payer: Self-pay | Admitting: Medical

## 2016-04-22 DIAGNOSIS — O99019 Anemia complicating pregnancy, unspecified trimester: Secondary | ICD-10-CM

## 2016-04-22 LAB — CBC
HEMATOCRIT: 27.5 % — AB (ref 34.0–46.6)
Hemoglobin: 9.2 g/dL — ABNORMAL LOW (ref 11.1–15.9)
MCH: 29.4 pg (ref 26.6–33.0)
MCHC: 33.5 g/dL (ref 31.5–35.7)
MCV: 88 fL (ref 79–97)
PLATELETS: 214 10*3/uL (ref 150–379)
RBC: 3.13 x10E6/uL — ABNORMAL LOW (ref 3.77–5.28)
RDW: 13.2 % (ref 12.3–15.4)
WBC: 10.3 10*3/uL (ref 3.4–10.8)

## 2016-04-22 LAB — HIV ANTIBODY (ROUTINE TESTING W REFLEX): HIV Screen 4th Generation wRfx: NONREACTIVE

## 2016-04-22 LAB — GLUCOSE TOLERANCE, 2 HOURS W/ 1HR
GLUCOSE, 1 HOUR: 191 mg/dL — AB (ref 65–179)
GLUCOSE, 2 HOUR: 127 mg/dL (ref 65–152)
GLUCOSE, FASTING: 81 mg/dL (ref 65–91)

## 2016-04-22 LAB — RPR: RPR Ser Ql: NONREACTIVE

## 2016-04-22 MED ORDER — FERROUS SULFATE 325 (65 FE) MG PO TABS: 325.0000 mg | ORAL_TABLET | Freq: Every day | ORAL | 3 refills | Status: AC

## 2016-04-23 ENCOUNTER — Telehealth: Payer: Self-pay | Admitting: General Practice

## 2016-04-23 ENCOUNTER — Encounter: Payer: Self-pay | Admitting: General Practice

## 2016-04-23 DIAGNOSIS — O24410 Gestational diabetes mellitus in pregnancy, diet controlled: Secondary | ICD-10-CM

## 2016-04-23 MED ORDER — ACCU-CHEK FASTCLIX LANCETS MISC: 1.0000 | Freq: Four times a day (QID) | 12 refills | Status: AC

## 2016-04-23 MED ORDER — ACCU-CHEK GUIDE W/DEVICE KIT: 1.0000 | PACK | Freq: Once | 0 refills | Status: AC

## 2016-04-23 MED ORDER — GLUCOSE BLOOD VI STRP: ORAL_STRIP | 12 refills | Status: AC

## 2016-04-23 NOTE — Telephone Encounter (Signed)
Called patient and informed her of GDM and testing supplies sent to pharmacy. [facility]. Patient verbalized understanding to all & had no questions

## 2016-04-28 ENCOUNTER — Encounter: Payer: Managed Care, Other (non HMO) | Attending: Medical

## 2016-04-28 VITALS — Ht 63.0 in | Wt 140.3 lb

## 2016-04-28 DIAGNOSIS — O24410 Gestational diabetes mellitus in pregnancy, diet controlled: Secondary | ICD-10-CM | POA: Insufficient documentation

## 2016-04-28 DIAGNOSIS — Z3A00 Weeks of gestation of pregnancy not specified: Secondary | ICD-10-CM | POA: Insufficient documentation

## 2016-04-28 NOTE — Progress Notes (Signed)
  Patient was seen on 04/28/16 for Gestational Diabetes self-management class at [facility]. The following learning objectives were met by the patient during this course:   States the definition of Gestational Diabetes  States why dietary management is important in controlling blood glucose  Describes the effects each nutrient has on blood glucose levels  Demonstrates ability to create a balanced meal plan  Demonstrates carbohydrate counting   States when to check blood glucose levels  Demonstrates proper blood glucose monitoring techniques  States the effect of stress and exercise on blood glucose levels  States the importance of limiting caffeine and abstaining from alcohol and smoking  Blood glucose monitor given: no (brought from home) Blood glucose reading: 83  Patient instructed to monitor glucose levels: FBS: 60 - <90 1 hour: <140 2 hour: <120  *Patient received handouts:  Nutrition Diabetes and Pregnancy  Carbohydrate Counting List  Patient will be seen for follow-up as needed.

## 2016-05-06 ENCOUNTER — Encounter: Payer: Self-pay | Admitting: *Deleted

## 2016-05-06 DIAGNOSIS — O24419 Gestational diabetes mellitus in pregnancy, unspecified control: Secondary | ICD-10-CM | POA: Insufficient documentation

## 2016-05-09 ENCOUNTER — Encounter: Payer: Self-pay | Admitting: General Practice

## 2016-05-09 NOTE — Telephone Encounter (Signed)
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  She has not entered values until pp lunch today which is 95.   Will continue to monitor.  She will need an appointment next week.

## 2016-05-13 ENCOUNTER — Ambulatory Visit (INDEPENDENT_AMBULATORY_CARE_PROVIDER_SITE_OTHER): Payer: Managed Care, Other (non HMO) | Admitting: Family Medicine

## 2016-05-13 VITALS — BP 102/55 | HR 70 | Wt 139.6 lb

## 2016-05-13 DIAGNOSIS — O24410 Gestational diabetes mellitus in pregnancy, diet controlled: Secondary | ICD-10-CM

## 2016-05-13 DIAGNOSIS — O283 Abnormal ultrasonic finding on antenatal screening of mother: Secondary | ICD-10-CM

## 2016-05-13 DIAGNOSIS — O0993 Supervision of high risk pregnancy, unspecified, third trimester: Secondary | ICD-10-CM

## 2016-05-13 DIAGNOSIS — Z87442 Personal history of urinary calculi: Secondary | ICD-10-CM | POA: Diagnosis not present

## 2016-05-13 NOTE — Patient Instructions (Addendum)
Gestational Diabetes Mellitus, Diagnosis Gestational diabetes (gestational diabetes mellitus) is a short-term (temporary) form of diabetes that can happen during pregnancy. It goes away after you give birth. It may be caused by one or both of these problems:  Your body does not make enough of a hormone called insulin.  Your body does not respond in a normal way to insulin that it makes. Insulin lets sugars (glucose) go into cells in the body. This gives you energy. If you have diabetes, sugars cannot get into cells. This causes high blood sugar (hyperglycemia). If diabetes is treated, it may not hurt you or your baby. Your doctor will set treatment goals for you. In general, you should have these blood sugar levels:  After not eating for a long time (fasting): 95 mg/dL (5.3 mmol/L).  After meals (postprandial):  One hour after a meal: at or below 140 mg/dL (7.8 mmol/L).  Two hours after a meal: at or below 120 mg/dL (6.7 mmol/L).  A1c (hemoglobin A1c) level: 6-6.5%. Follow these instructions at home: Questions to Ask Your Doctor   You may want to ask these questions:  Do I need to meet with a diabetes educator?  Where can I find a support group for people with diabetes?  What equipment will I need to care for myself at home?  What diabetes medicines do I need? When should I take them?  How often do I need to check my blood sugar?  What number can I call if I have questions?  When is my next doctor's visit? General instructions   Take over-the-counter and prescription medicines only as told by your doctor.  Stay at a healthy weight during pregnancy.  Keep all follow-up visits as told by your doctor. This is important. Contact a doctor if:  Your blood sugar is at or above 240 mg/dL (16.1 mmol/L).  Your blood sugar is at or above 200 mg/dL (09.6 mmol/L) and you have ketones in your pee (urine).  You have been sick or have had a fever for 2 days or more and you are not  getting better.  You have any of these problems for more than 6 hours:  You cannot eat or drink.  You feel sick to your stomach (nauseous).  You throw up (vomit).  You have watery poop (diarrhea). Get help right away if:  Your blood sugar is lower than 54 mg/dL (3 mmol/L).  You get confused.  You have trouble:  Thinking clearly.  Breathing.  Your baby moves less than normal.  You have:  Moderate or large ketone levels in your pee (urine).  Bleeding from your vagina.  Unusual fluid coming from your vagina.  Early contractions. These may feel like tightness in your belly. This information is not intended to replace advice given to you by your health care provider. Make sure you discuss any questions you have with your health care provider. Document Released: 05/07/2015 Document Revised: 06/21/2015 Document Reviewed: 02/16/2015 Elsevier Interactive Patient Education  2017 [employer].   Third Trimester of Pregnancy The third trimester is from week 29 through week 42, months 7 through 9. This trimester is when your unborn baby (fetus) is growing very fast. At the end of the ninth month, the unborn baby is about 20 inches in length. It weighs about 6-10 pounds. Follow these instructions at home:  Avoid all smoking, herbs, and alcohol. Avoid drugs not approved by your doctor.  Do not use any tobacco products, including cigarettes, chewing tobacco, and electronic  cigarettes. If you need help quitting, ask your doctor. You may get counseling or other support to help you quit.  Only take medicine as told by your doctor. Some medicines are safe and some are not during pregnancy.  Exercise only as told by your doctor. Stop exercising if you start having cramps.  Eat regular, healthy meals.  Wear a good support bra if your breasts are tender.  Do not use hot tubs, steam rooms, or saunas.  Wear your seat belt when driving.  Avoid raw meat, uncooked cheese, and liter  boxes and soil used by cats.  Take your prenatal vitamins.  Take 1500-2000 milligrams of calcium daily starting at the 20th week of pregnancy until you deliver your baby.  Try taking medicine that helps you poop (stool softener) as needed, and if your doctor approves. Eat more fiber by eating fresh fruit, vegetables, and whole grains. Drink enough fluids to keep your pee (urine) clear or pale yellow.  Take warm water baths (sitz baths) to soothe pain or discomfort caused by hemorrhoids. Use hemorrhoid cream if your doctor approves.  If you have puffy, bulging veins (varicose veins), wear support hose. Raise (elevate) your feet for 15 minutes, 3-4 times a day. Limit salt in your diet.  Avoid heavy lifting, wear low heels, and sit up straight.  Rest with your legs raised if you have leg cramps or low back pain.  Visit your dentist if you have not gone during your pregnancy. Use a soft toothbrush to brush your teeth. Be gentle when you floss.  You can have sex (intercourse) unless your doctor tells you not to.  Do not travel far distances unless you must. Only do so with your doctor's approval.  Take prenatal classes.  Practice driving to the hospital.  Pack your hospital bag.  Prepare the baby's room.  Go to your doctor visits. Get help if:  You are not sure if you are in labor or if your water has broken.  You are dizzy.  You have mild cramps or pressure in your lower belly (abdominal).  You have a nagging pain in your belly area.  You continue to feel sick to your stomach (nauseous), throw up (vomit), or have watery poop (diarrhea).  You have bad smelling fluid coming from your vagina.  You have pain with peeing (urination). Get help right away if:  You have a fever.  You are leaking fluid from your vagina.  You are spotting or bleeding from your vagina.  You have severe belly cramping or pain.  You lose or gain weight rapidly.  You have trouble catching your  breath and have chest pain.  You notice sudden or extreme puffiness (swelling) of your face, hands, ankles, feet, or legs.  You have not felt the baby move in over an hour.  You have severe headaches that do not go away with medicine.  You have vision changes. This information is not intended to replace advice given to you by your health care provider. Make sure you discuss any questions you have with your health care provider. Document Released: 04/09/2009 Document Revised: 06/21/2015 Document Reviewed: 03/16/2012 Elsevier Interactive Patient Education  2017 [employer] Inc[employer]  CIRCUMCISION PLACES Places to have your son circumcised:    [facility] [phone] $480 by 4 wks  [facility] [phone] $244 by 4 wks  [facility] [phone] $175 by 2 wks  [facility] [phone] $250 by 7 days [facility] [phone] $150 by 4 wks  These prices sometimes change but  are roughly what you can expect to pay. Please call and confirm pricing.   Circumcision is considered an elective/non-medically necessary procedure. There are many reasons parents decide to have their sons circumsized. During the first year of life circumcised males have a reduced risk of urinary tract infections but after this year the rates between circumcised males and uncircumcised males are the same.  It is safe to have your son circumcised outside of the hospital and the places above perform them regularly.

## 2016-05-13 NOTE — Progress Notes (Signed)
Subjective:  Jeanne Duran is a 21 y.o. G1P0 at [redacted]w[redacted]d being seen today for ongoing prenatal care.  She is currently monitored for the following issues for this high-risk pregnancy and has ADHD (attention deficit hyperactivity disorder); Supervision of high risk pregnancy in third trimester; History of kidney stones; Scoliosis; Abnormal fetal ultrasound; and Gestational diabetes on her problem list.  Patient reports no complaints.  Contractions: Not present. Vag. Bleeding: None.  Movement: Present. Denies leaking of fluid.   States she often forgets to do BS after meals. Does not log into [employer] but her [employer] automatically uploads to an app, she has on her phone. See below for #s.   The following portions of the patient's history were reviewed and updated as appropriate: allergies, current medications, past family history, past medical history, past social history, past surgical history and problem list. Problem list updated.  Objective:   Vitals:   05/13/16 1244  BP: (!) 102/55  Pulse: 70  Weight: 139 lb 9.6 oz (63.3 kg)    Fetal Status: Fetal Heart Rate (bpm): 136 Fundal Height: 29 cm Movement: Present      General:  Alert, oriented and cooperative. Patient is in no acute distress.  Skin: Skin is warm and dry. No rash noted.   Cardiovascular: Normal heart rate noted  Respiratory: Normal respiratory effort, no problems with respiration noted  Abdomen: Soft, gravid, appropriate for gestational age. Pain/Pressure: Absent     Pelvic:  Cervical exam deferred        Extremities: Normal range of motion.  Edema: None  Mental Status: Normal mood and affect. Normal behavior. Normal judgment and thought content.   Urinalysis:      Assessment and Plan:  Pregnancy: G1P0 at [redacted]w[redacted]d  1. Supervision of high risk pregnancy in third trimester - Consider [place] if still small next week (size < dates, by 2cm)  2. Diet controlled gestational diabetes mellitus (GDM) in third trimester -  Logging BS on [employer] app on phone. Highest BS was 127, but normally is in 80-90s. Fairly normal BS readings, but not consistent. Below are readings from app: FBS: April 3- 80;  April 17- 84 2 hr PP Break: April 3- 76 2 hr PP Lunch: April 2-5: 83/x/117/115 (no readings from 6-9), April 10: 95, (no readings 11-12), April 13: 127, (no readings 14-15), April 16: 86  April 2 dinner: 91  April 3: 79 - Discussed with patient importance of logging BS. She states she will set alarm at end of meal for 2 hours, to be reminded to take 2 hr PP BS readings. She will also put an alarm for same time every morning for FBS. To come back at next visit with all numbers. She will try to log into [employer] every night.   3. Abnormal fetal ultrasound - Clubbed feet - Plans to have surgery for baby, has been in contact with some specialists from [facility] area.  4. History of kidney stones   MOC: Depo, considering other LARCs MOF: Try breast but likely bottle due to work CIRC: Yes, outpatient   Preterm labor symptoms and general obstetric precautions including but not limited to vaginal bleeding, contractions, leaking of fluid and fetal movement were reviewed in detail with the patient. Please refer to After Visit Summary for other counseling recommendations.  Return in about 1 week (around 05/20/2016) for Routine OB visit - bring in BS logs.   Jen Mow, DO [facility] Fellow [facility]

## 2016-05-19 ENCOUNTER — Encounter: Payer: Managed Care, Other (non HMO) | Admitting: Family Medicine

## 2016-05-26 ENCOUNTER — Telehealth: Payer: Self-pay | Admitting: General Practice

## 2016-05-26 ENCOUNTER — Encounter: Payer: Managed Care, Other (non HMO) | Admitting: Obstetrics and Gynecology

## 2016-10-08 ENCOUNTER — Encounter (HOSPITAL_COMMUNITY): Payer: Self-pay

## 2018-11-03 IMAGING — US US MFM OB COMP +14 WKS
1 series · 13 of 28 positions shown · non-contrast
Comparison: none

[Series 1: us mfm ob comp +14 wks · 63 acquisitions, 13 frames shown]
[im 3/63]
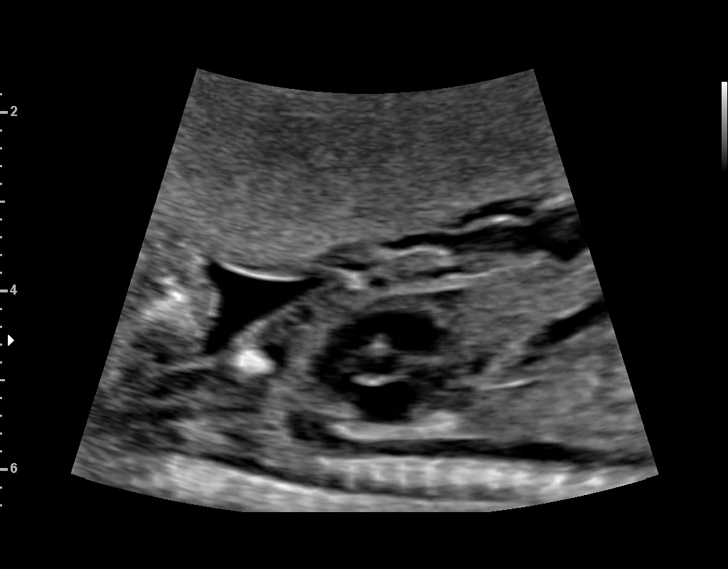
[im 7/63]
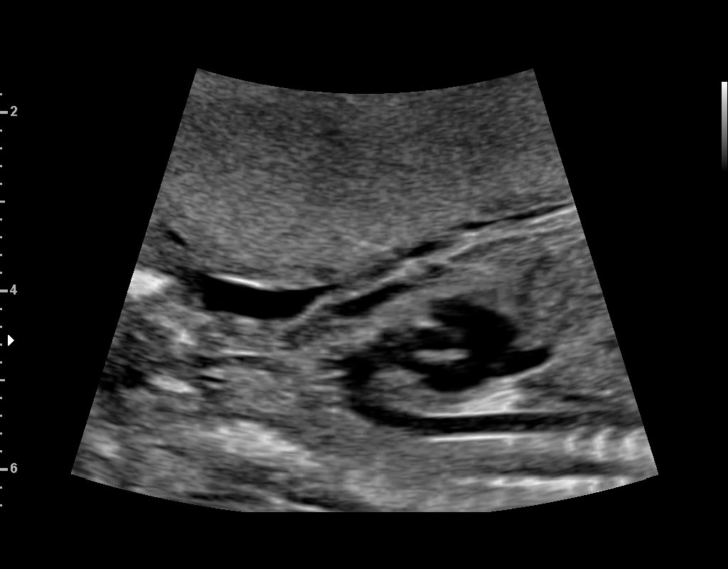
[im 12/63]
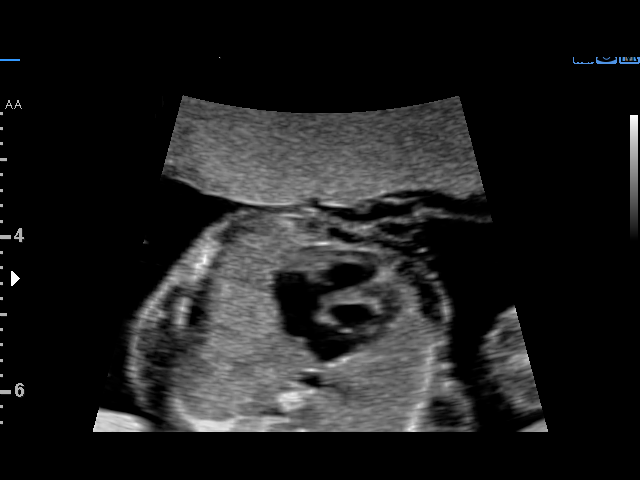
[im 17/63]
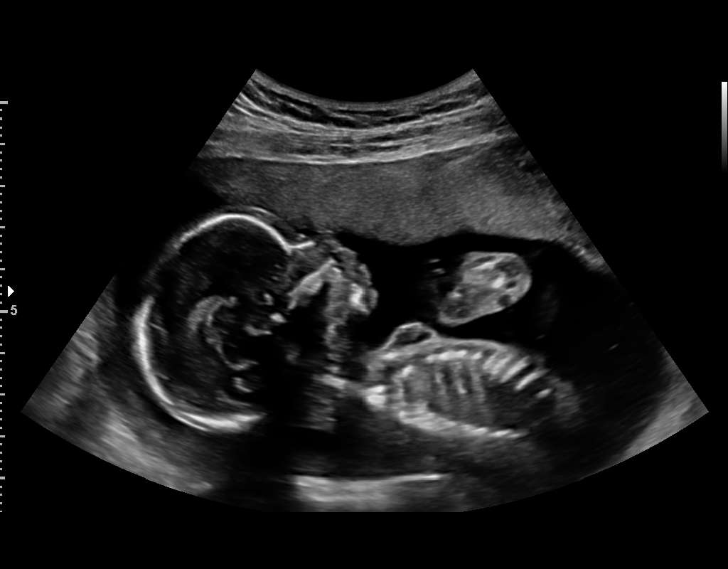
[im 21/63]
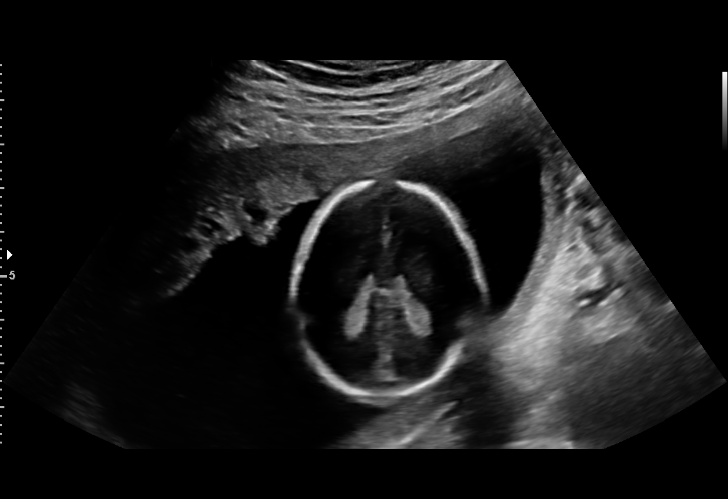
[im 26/63]
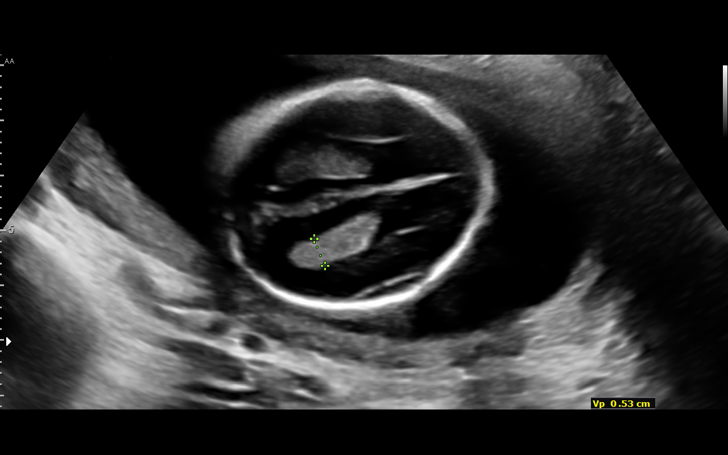
[im 33/63]
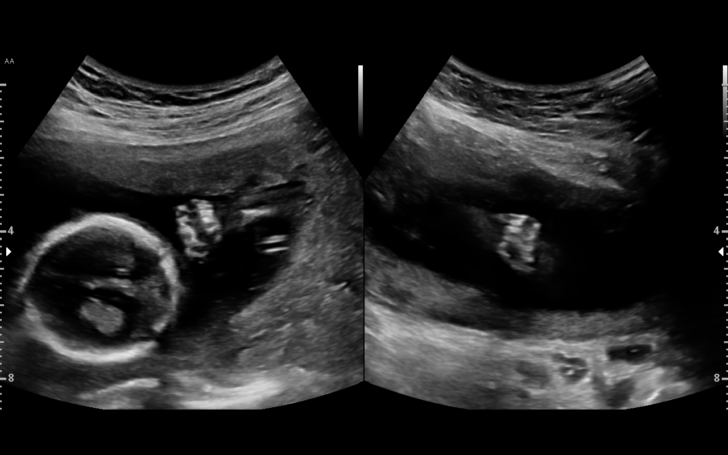
[im 37/63]
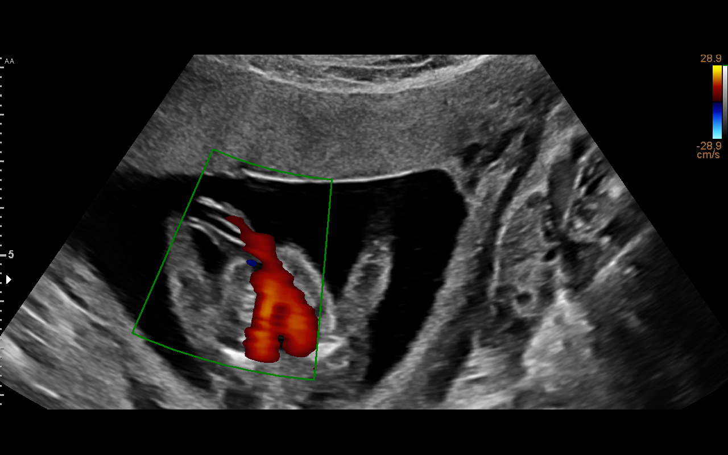
[im 42/63]
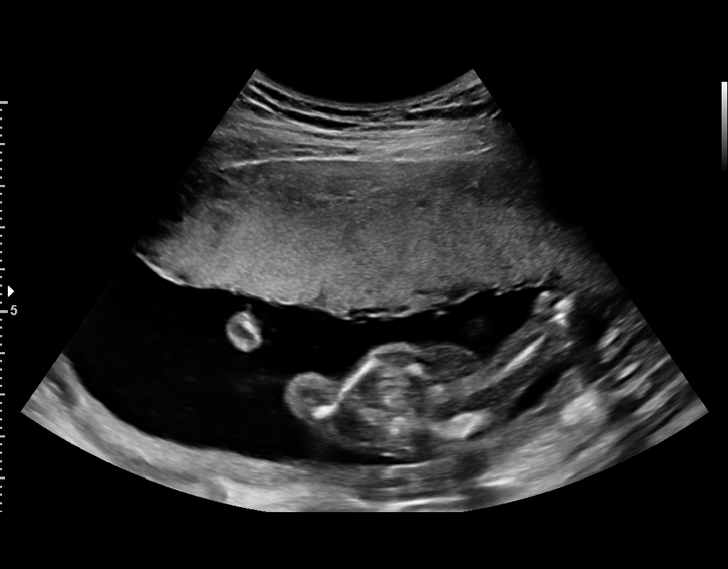
[im 46/63]
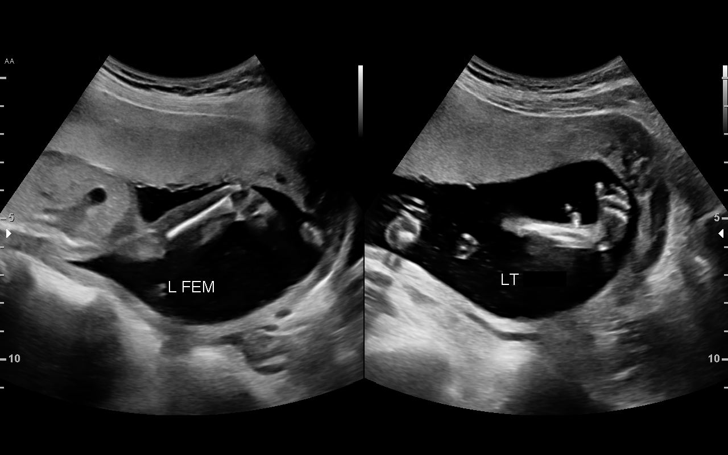
[im 51/63]
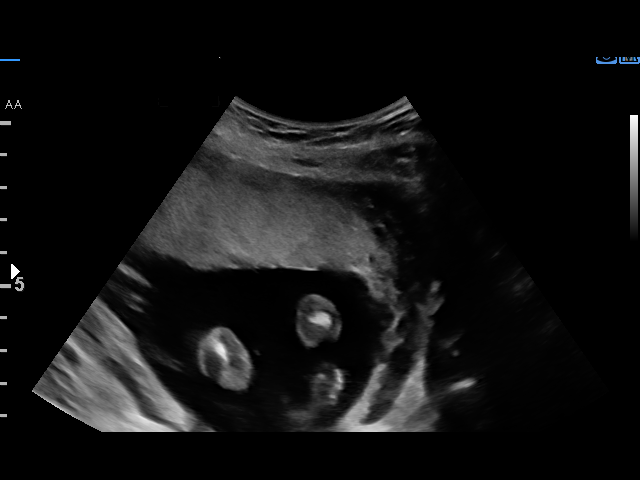
[im 56/63]
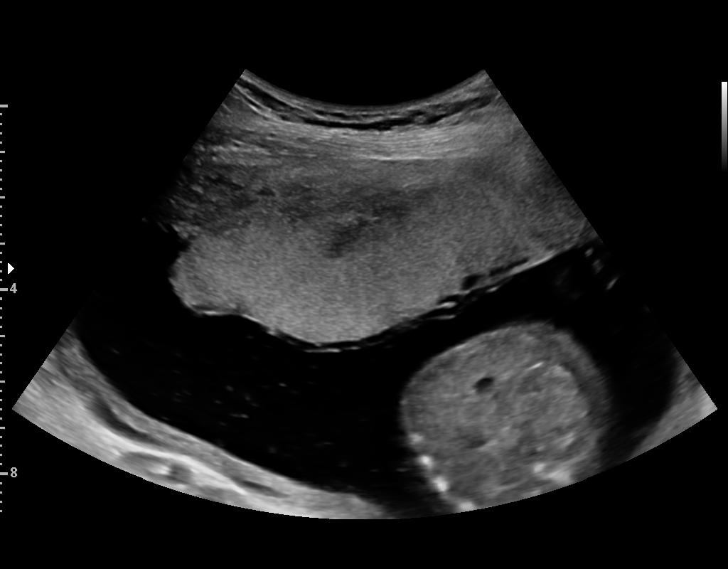
[im 60/63]
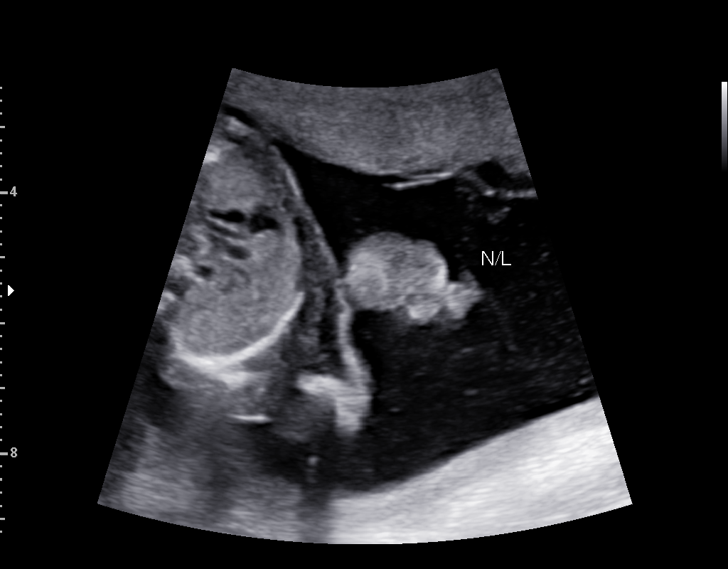

[13 of 28 positions shown; findings below may reference images not displayed]

OB/Gyn Clinic

1  GRADEL BOIS         [id number]      [id number]     [id number]
Indications

18 weeks gestation of pregnancy
Encounter for fetal anatomic survey
OB History

Gravidity:    1
Fetal Evaluation

Num Of Fetuses:     1
Fetal Heart         157
Rate(bpm):
Cardiac Activity:   Observed
Presentation:       Breech
Placenta:           Anterior, above cervical os
P. Cord Insertion:  Visualized

Amniotic Fluid
AFI FV:      Subjectively within normal limits

Largest Pocket(cm)
4.7
Biometry

BPD:      41.9  mm     G. Age:  18w 5d         69  %    CI:        73.65   %    70 - 86
FL/HC:      17.8   %    15.8 - 18
HC:      155.1  mm     G. Age:  18w 3d         51  %    HC/AC:      1.23        1.07 -
AC:      125.9  mm     G. Age:  18w 1d         44  %    FL/BPD:     65.9   %
FL:       27.6  mm     G. Age:  18w 3d         50  %    FL/AC:      21.9   %    20 - 24
CER:      18.3  mm     G. Age:  18w 1d         45  %
NFT:       2.7  mm

CM:        3.3  mm

Est. FW:     235  gm      0 lb 8 oz     49  %
Gestational Age

LMP:           15w 2d        Date:  10/27/15                 EDD:   08/02/16
U/S Today:     18w 3d                                        EDD:   07/11/16
Best:          18w 2d     Det. By:  Early Ultrasound         EDD:   07/12/16
(12/04/15)
Anatomy

Cranium:               Appears normal         Aortic Arch:            Appears normal
Cavum:                 Appears normal         Ductal Arch:            Appears normal
Ventricles:            Appears normal         Diaphragm:              Appears normal
Choroid Plexus:        Appears normal         Stomach:                Appears normal, left
sided
Cerebellum:            Appears normal         Abdomen:                Appears normal
Posterior Fossa:       Appears normal         Abdominal Wall:         Appears nml (cord
insert, abd wall)
Nuchal Fold:           Appears normal         Cord Vessels:           Appears normal (3
vessel cord)
Face:                  Appears normal         Kidneys:                Appear normal
(orbits and profile)
Lips:                  Appears normal         Bladder:                Appears normal
Palate:                Appears normal         Spine:                  Not well visualized
Heart:                 Appears normal         Upper Extremities:      Appears normal
(4CH, axis, and situs
RVOT:                  Appears normal         Lower Extremities:      Clubfeet
LVOT:                  Appears normal

Other:  Nasal bone visualized. Parents do not wish to know sex of fetus.
Fetus appears to be a male.
Cervix Uterus Adnexa

Cervix
Length:           3.64  cm.
Normal appearance by transabdominal scan.

Left Ovary
Not visualized. No adnexal mass visualized.

Right Ovary
Not visualized. No adnexal mass visualized.
Impression

Singleton intrauterine pregnancy at 18+2 weeks, here for
anatomic survey
Review of the anatomy shows bilateral equinovarus
deformity. There are no other sonographic markers for
aneuploidy or structural anomalies
Amniotic fluid volume is normal
Estimated fetal weight is 235g which is growth in the 49th
percentile
Recommendations

Recommend an appointment for HR scan and genetic
counseling within 2 weeks.

## 2018-12-01 IMAGING — US US MFM OB FOLLOW-UP
1 series · 14 of 28 positions shown · non-contrast
Comparison: none

[Series 1: us mfm ob follow-up · 55 acquisitions, 14 frames shown]
[im 3/55]
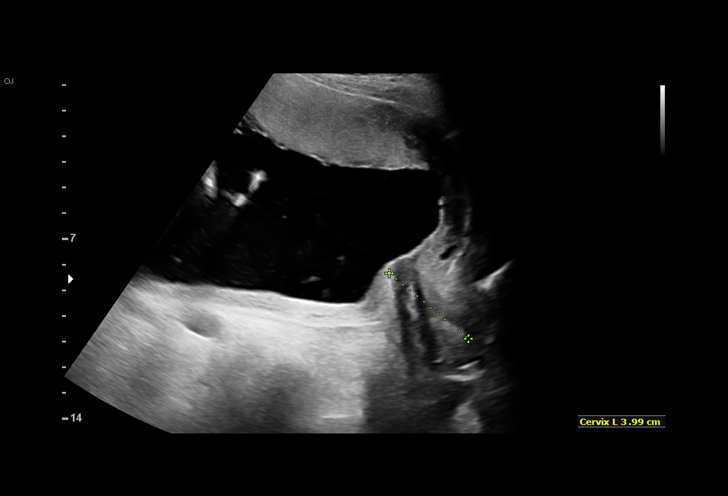
[im 7/55]
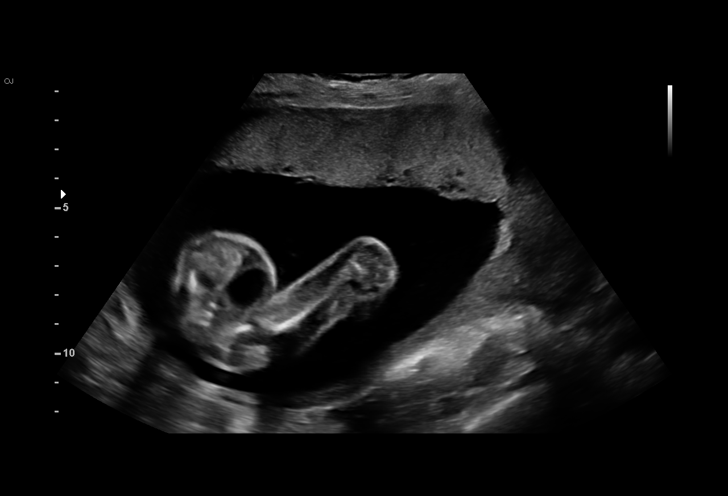
[im 11/55]
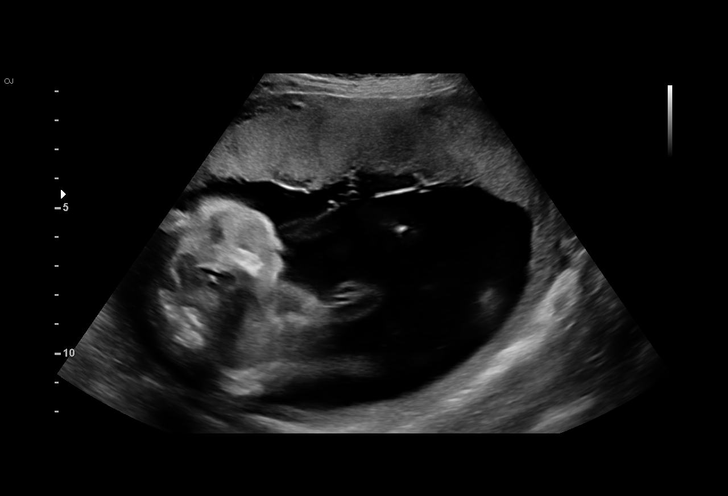
[im 15/55]
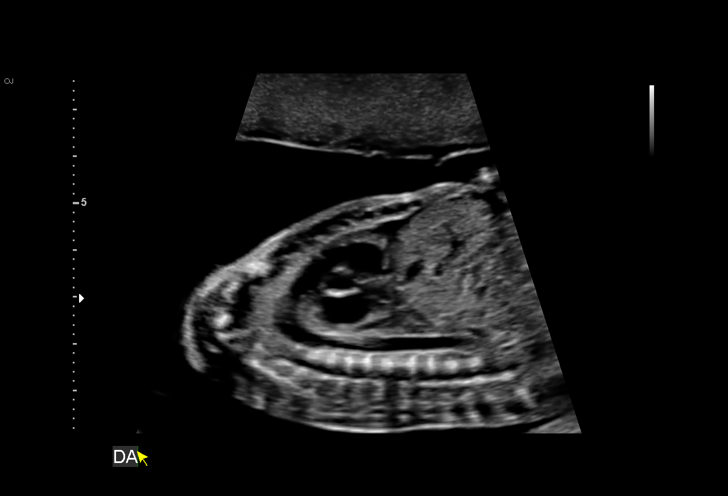
[im 19/55]
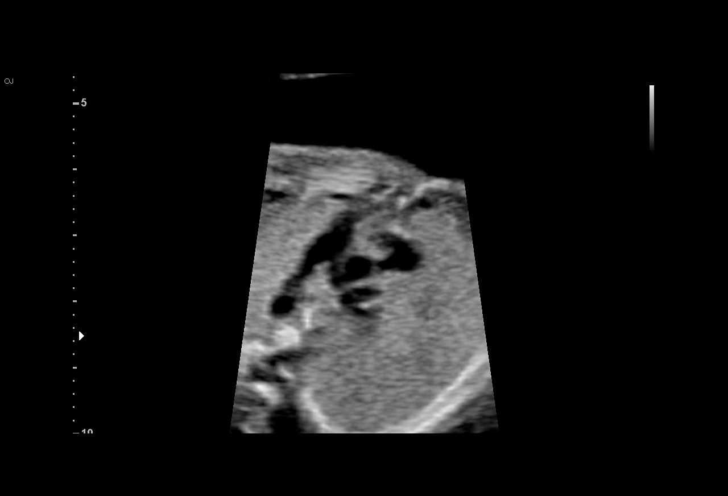
[im 23/55]
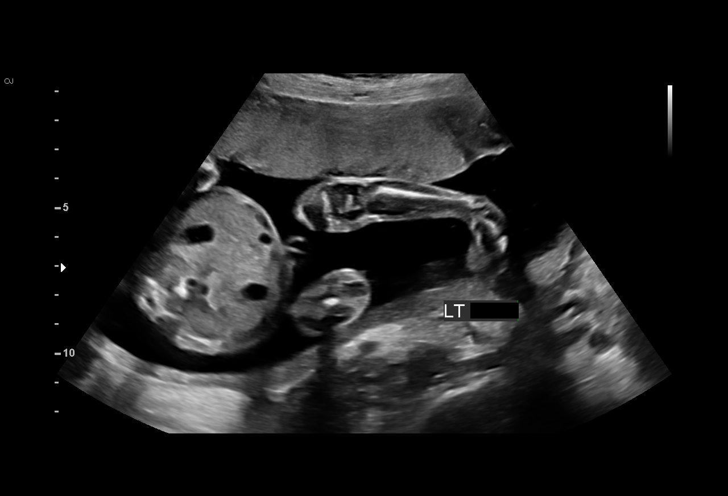
[im 27/55]
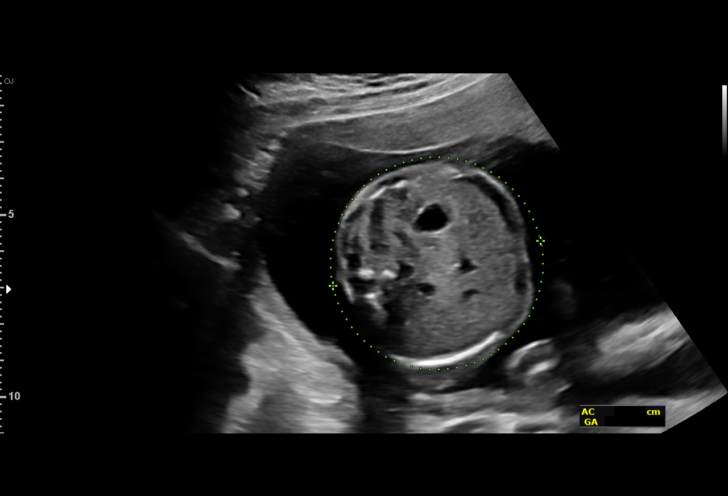
[im 31/55]
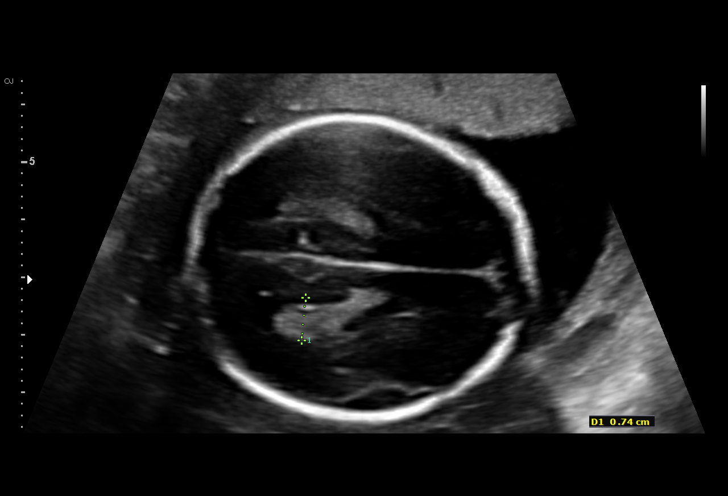
[im 35/55]
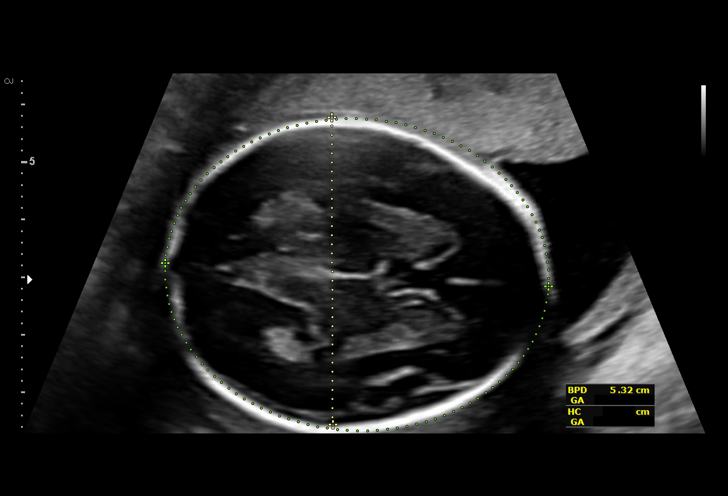
[im 39/55]
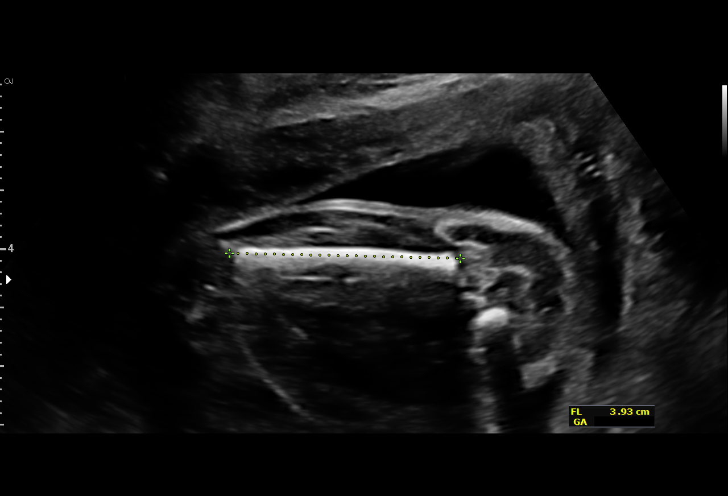
[im 43/55]
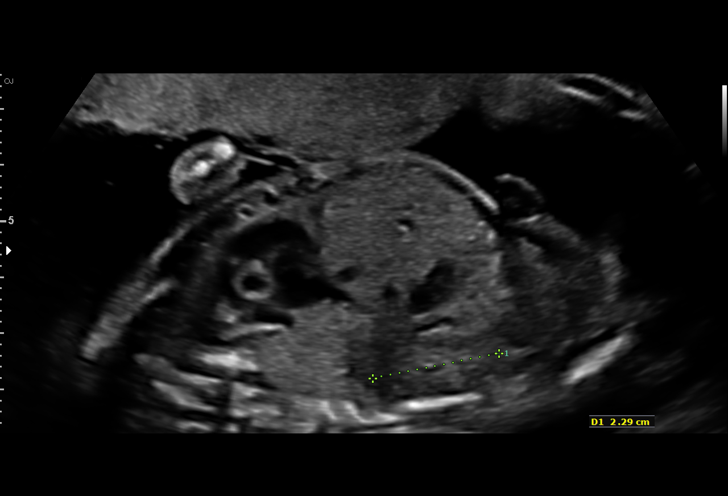
[im 47/55]
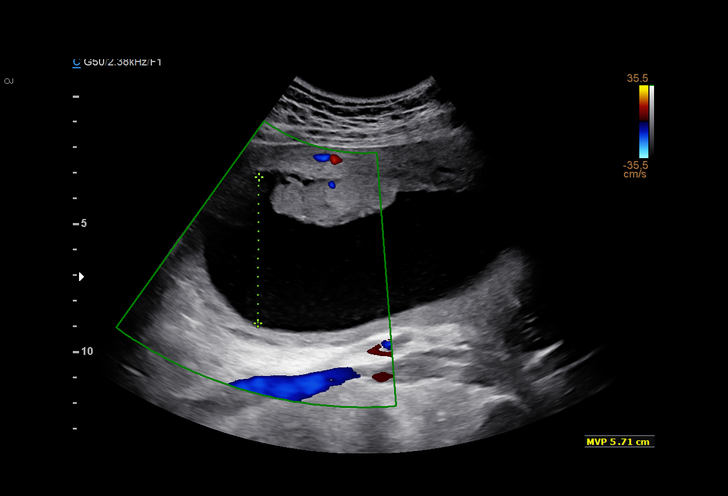
[im 51/55]
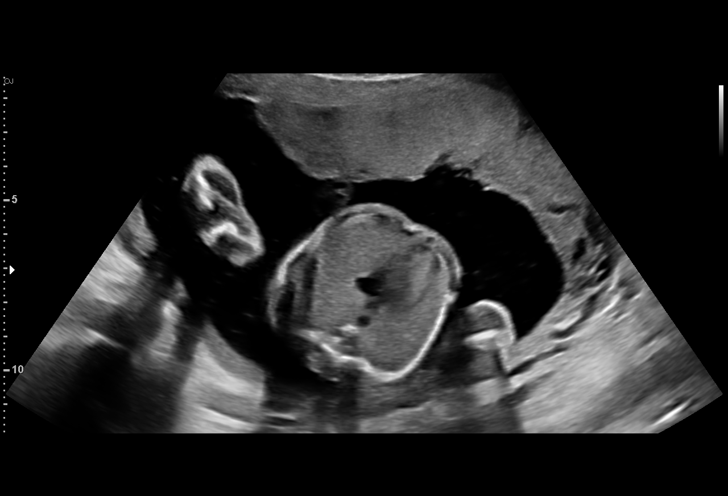
[im 55/55]
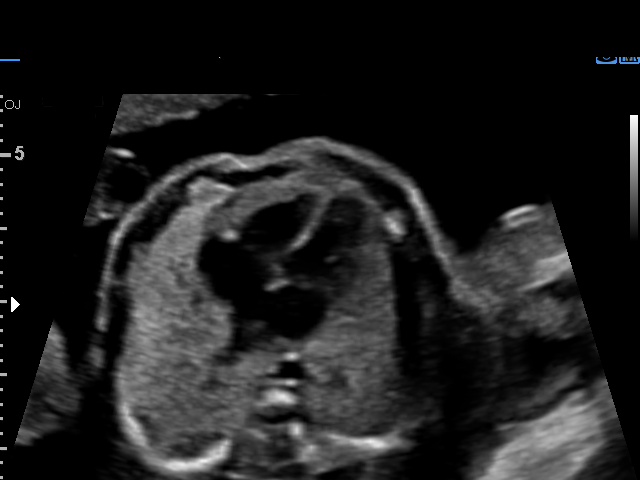

[14 of 28 positions shown; findings below may reference images not displayed]

OB/Gyn Clinic

Indications

22 weeks gestation of pregnancy
Club foot; low risk QUAD screen
Encounter for other antenatal screening
follow-up
OB History

Gravidity:    1
Fetal Evaluation

Num Of Fetuses:     1
Fetal Heart         151
Rate(bpm):
Cardiac Activity:   Observed
Presentation:       Transverse, head to maternal right
Placenta:           Anterior, above cervical os
P. Cord Insertion:  Visualized

Amniotic Fluid
AFI FV:      Subjectively within normal limits

Largest Pocket(cm)
5.71
Biometry
BPD:      53.7  mm     G. Age:  22w 2d         48  %    CI:        75.96   %   70 - 86
FL/HC:      20.1   %   18.4 -
HC:      195.3  mm     G. Age:  21w 5d         19  %    HC/AC:      1.06       1.06 -
AC:      184.3  mm     G. Age:  23w 2d         72  %    FL/BPD:     73.0   %   71 - 87
FL:       39.2  mm     G. Age:  22w 4d         50  %    FL/AC:      21.3   %   20 - 24
HUM:      38.2  mm     G. Age:  23w 4d         76  %
CER:      25.1  mm     G. Age:  23w 1d         62  %
Est. FW:     537  gm      1 lb 3 oz     57  %
Gestational Age

LMP:           19w 2d       Date:   10/27/15                 EDD:   08/02/16
U/S Today:     22w 3d                                        EDD:   07/11/16
Best:          22w 2d    Det. By:   Early Ultrasound         EDD:   07/12/16
(12/04/15)
Anatomy

Cranium:               Appears normal         Aortic Arch:            Previously seen
Cavum:                 Appears normal         Ductal Arch:            Appears normal
Ventricles:            Appears normal         Diaphragm:              Appears normal
Choroid Plexus:        Appears normal         Stomach:                Appears normal, left
sided
Cerebellum:            Appears normal         Abdomen:                Appears normal
Posterior Fossa:       Appears normal         Abdominal Wall:         Previously seen
Nuchal Fold:           Previously seen        Cord Vessels:           Appears normal (3
vessel cord)
Face:                  Orbits and profile     Kidneys:                Appear normal
previously seen
Lips:                  Previously seen        Bladder:                Appears normal
Palate:                Previously seen        Spine:                  Limited views
appear normal
Heart:                 Appears normal         Upper Extremities:      Previously seen
(4CH, axis, and situs
RVOT:                  Appears normal         Lower Extremities:      Clubfeet
LVOT:                  Appears normal

Other:  Nasal bone visualized. Fetus appears to be a male.
Cervix Uterus Adnexa

Cervix
Length:           3.99  cm.
Normal appearance by transabdominal scan.

Uterus
No abnormality visualized.

Left Ovary
Not visualized. No adnexal mass visualized.

Right Ovary
Not visualized. No adnexal mass visualized.
Impression

SIUP at 22+2 weeks
Bilateral clubbed feet - appeared to be an isolated finding
All other interval fetal anatomy was seen and appeared
normal; anatomic survey complete
Normal amniotic fluid volume
Appropriate interval growth with EFW at the 57th %tile

Ms. Ji-Hong received genetic counseling today. Please see
note.
Recommendations

Follow-up as clinically indicated
